# Patient Record
Sex: Male | Born: 1950
Health system: Southern US, Community
[De-identification: ages and names within clinical notes are randomized; demographics above are authoritative.]

## PROBLEM LIST (undated history)

## (undated) DIAGNOSIS — I10 Essential (primary) hypertension: Secondary | ICD-10-CM

## (undated) DIAGNOSIS — F0390 Unspecified dementia without behavioral disturbance: Secondary | ICD-10-CM

## (undated) HISTORY — PX: WRIST SURGERY: SHX841

## (undated) HISTORY — PX: OTHER SURGICAL HISTORY: SHX169

---

## 2006-09-22 ENCOUNTER — Ambulatory Visit (HOSPITAL_COMMUNITY): Admission: RE | Admit: 2006-09-22 | Discharge: 2006-09-22 | Payer: Self-pay | Admitting: Family Medicine

## 2007-11-25 ENCOUNTER — Ambulatory Visit (HOSPITAL_COMMUNITY): Admission: RE | Admit: 2007-11-25 | Discharge: 2007-11-25 | Payer: Self-pay | Admitting: Family Medicine

## 2007-11-29 ENCOUNTER — Ambulatory Visit: Payer: Self-pay | Admitting: Cardiology

## 2007-12-13 ENCOUNTER — Ambulatory Visit: Payer: Self-pay | Admitting: Cardiology

## 2007-12-14 ENCOUNTER — Ambulatory Visit: Payer: Self-pay | Admitting: Cardiology

## 2008-01-27 ENCOUNTER — Ambulatory Visit (HOSPITAL_COMMUNITY): Admission: RE | Admit: 2008-01-27 | Discharge: 2008-01-27 | Payer: Self-pay | Admitting: Internal Medicine

## 2008-01-27 ENCOUNTER — Encounter (INDEPENDENT_AMBULATORY_CARE_PROVIDER_SITE_OTHER): Payer: Self-pay | Admitting: Internal Medicine

## 2008-10-03 ENCOUNTER — Ambulatory Visit (HOSPITAL_COMMUNITY): Admission: RE | Admit: 2008-10-03 | Discharge: 2008-10-03 | Payer: Self-pay | Admitting: Family Medicine

## 2008-11-24 ENCOUNTER — Ambulatory Visit: Admission: RE | Admit: 2008-11-24 | Discharge: 2008-11-24 | Payer: Self-pay | Admitting: Family Medicine

## 2009-10-08 ENCOUNTER — Encounter: Payer: Self-pay | Admitting: Emergency Medicine

## 2009-10-09 ENCOUNTER — Inpatient Hospital Stay (HOSPITAL_COMMUNITY): Admission: EM | Admit: 2009-10-09 | Discharge: 2009-10-16 | Payer: Self-pay | Admitting: Emergency Medicine

## 2009-10-12 ENCOUNTER — Ambulatory Visit: Payer: Self-pay | Admitting: Physical Medicine & Rehabilitation

## 2009-10-18 ENCOUNTER — Encounter
Admission: RE | Admit: 2009-10-18 | Discharge: 2010-01-16 | Payer: Self-pay | Source: Home / Self Care | Admitting: Orthopedic Surgery

## 2009-11-06 ENCOUNTER — Encounter: Admission: RE | Admit: 2009-11-06 | Discharge: 2009-11-06 | Payer: Self-pay | Admitting: Neurosurgery

## 2009-12-10 ENCOUNTER — Ambulatory Visit: Payer: Self-pay | Admitting: Psychology

## 2010-04-12 ENCOUNTER — Ambulatory Visit (INDEPENDENT_AMBULATORY_CARE_PROVIDER_SITE_OTHER): Payer: BC Managed Care – PPO | Admitting: Urology

## 2010-04-12 DIAGNOSIS — R39198 Other difficulties with micturition: Secondary | ICD-10-CM

## 2010-04-12 DIAGNOSIS — R351 Nocturia: Secondary | ICD-10-CM

## 2010-04-12 DIAGNOSIS — N401 Enlarged prostate with lower urinary tract symptoms: Secondary | ICD-10-CM

## 2010-04-25 LAB — CBC
HCT: 41.5 % (ref 39.0–52.0)
MCH: 29.2 pg (ref 26.0–34.0)
MCV: 86.6 fL (ref 78.0–100.0)
Platelets: 171 10*3/uL (ref 150–400)
RDW: 14 % (ref 11.5–15.5)
WBC: 9.1 10*3/uL (ref 4.0–10.5)

## 2010-04-25 LAB — BASIC METABOLIC PANEL
Calcium: 8.8 mg/dL (ref 8.4–10.5)
Chloride: 107 mEq/L (ref 96–112)
Creatinine, Ser: 0.91 mg/dL (ref 0.4–1.5)
Sodium: 141 mEq/L (ref 135–145)

## 2010-04-26 LAB — HEPATIC FUNCTION PANEL
ALT: 16 U/L (ref 0–53)
AST: 23 U/L (ref 0–37)
Indirect Bilirubin: 0.6 mg/dL (ref 0.3–0.9)
Total Bilirubin: 0.8 mg/dL (ref 0.3–1.2)
Total Protein: 6.5 g/dL (ref 6.0–8.3)

## 2010-04-26 LAB — DIFFERENTIAL
Basophils Absolute: 0.5 10*3/uL — ABNORMAL HIGH (ref 0.0–0.1)
Basophils Relative: 2 % — ABNORMAL HIGH (ref 0–1)
Eosinophils Absolute: 0 10*3/uL (ref 0.0–0.7)
Eosinophils Relative: 0 % (ref 0–5)
Lymphs Abs: 1 10*3/uL (ref 0.7–4.0)
Monocytes Absolute: 0.7 10*3/uL (ref 0.1–1.0)
Monocytes Relative: 4 % (ref 3–12)
Neutrophils Relative %: 89 % — ABNORMAL HIGH (ref 43–77)

## 2010-04-26 LAB — BASIC METABOLIC PANEL
BUN: 7 mg/dL (ref 6–23)
BUN: 5 mg/dL — ABNORMAL LOW (ref 6–23)
Calcium: 8.7 mg/dL (ref 8.4–10.5)
Creatinine, Ser: 0.96 mg/dL (ref 0.4–1.5)
Creatinine, Ser: 0.86 mg/dL (ref 0.4–1.5)
GFR calc Af Amer: >60 mL/min (ref >60)
GFR calc non Af Amer: >60 mL/min (ref >60)
Glucose, Bld: 130 mg/dL — ABNORMAL HIGH (ref 70–99)
Potassium: 4.4 mEq/L (ref 3.5–5.1)
Sodium: 137 mEq/L (ref 135–145)
Sodium: 138 mEq/L (ref 135–145)

## 2010-04-26 LAB — VITAMIN B12: Vitamin B-12: 386 pg/mL (ref 211–911)

## 2010-04-26 LAB — ETHANOL: Alcohol, Ethyl (B): <5 mg/dL (ref 0–10)

## 2010-04-26 LAB — CBC: Hemoglobin: 15.1 g/dL (ref 13.0–17.0)

## 2010-06-25 NOTE — Procedures (Signed)
[facility]                              EXERCISE TREADMILL   NAME:Luis Warner, Luis Warner                     MRN:          [id number]  DATE:12/13/2007                            DOB:          08-06-1950    REFERRING PHYSICIAN:  Angus G. Renard Matter, MD   CLINICAL DATA:  A 60 year old gentleman with chest discomfort.  1. Treadmill exercise performed to a workload of 10.4 METS and a heart      rate of 144, 89% of age-predicted maximum.  2. Exercise discontinued due to dyspnea; no chest pain reported.  3. Blood pressure increased from a resting value of 160/85 to 180/80      during exercise, a flat response from a hypertensive baseline.  4. No arrhythmias noted.  5. Baseline EKG:  Normal sinus rhythm; prominent voltage;      nondiagnostic inferior Q waves.   STRESS EKG:  1.5-2 mm of upsloping ST-segment depression in the inferior  leads as well as V5-V6.  EKG changes reverted towards baseline gradually  during the 5 minutes of recovery.   IMPRESSION:  Negative-graded exercise test revealing adequate exercise  capacity, a flat blood pressure response with resting hypertension, no  reproduction of the patient's chest discomfort with exertion and no  diagnostic electrocardiographic evidence for myocardial ischemia.  Other  findings as noted.     Gerrit Friends. Dietrich Pates, MD, [facility]  Electronically Signed    RMR/MedQ  DD: 12/14/2007  DT: 12/15/2007  Job #: [id number]

## 2010-06-25 NOTE — Op Note (Signed)
NAME:  Luis Warner, Luis Warner              ACCOUNT NO.:  [id number]   MEDICAL RECORD NO.:  [id number]          PATIENT TYPE:  AMB   LOCATION:  DAY                           FACILITY:  [facility]   PHYSICIAN:  Lionel December, M.D.    DATE OF BIRTH:  Jul 17, 1950   DATE OF PROCEDURE:  01/27/2008  DATE OF DISCHARGE:                               OPERATIVE REPORT   PROCEDURE:  Colonoscopy with snare polypectomy.   INDICATIONS:  [name] is a 60 year old Caucasian male who is undergoing  average risk screening colonoscopy.  Procedure and risks were reviewed  with the patient and informed consent was obtained.   MEDICATIONS FOR CONSCIOUS SEDATION:  Demerol 50 mg IV and Versed 5 mg  IV.   FINDINGS:  Procedure performed in endoscopy suite.  The patient's vital  signs and O2 sat were monitored during the procedure and remained  stable.  The patient was placed in left lateral recumbent position,  rectal examination performed.  No abnormality noted on external or  digital exam.  Pentax videoscope was placed in the rectum and advanced  under vision into sigmoid colon beyond.  Preparation was excellent.  Two  tiny diverticula were noted in the sigmoid colon.  Scope was passed into  the cecum, which was identified by appendiceal orifice and ileocecal  valve.  Pictures taken for the record.  As the scope was withdrawn,  colonic mucosa was carefully examined.  There was a 5-mm polyp at the  distal transverse colon, which was snared.  Rest of the polyp was  coagulated during this process.  Polypectomy was complete.  There was a  15-mm pedunculated polyp at distal sigmoid colon.  This was snared and  retrieved for histologic examination.  Mucosa of rest of the colon and  rectum was normal.  While in the rectum, scope was retroflexed to  examine anorectal junction, which was unremarkable.  Endoscope was  straightened and withdrawn.  The patient tolerated the procedure well.   FINAL DIAGNOSES:  1. Examination performed  to cecum.  2. A 15-mm pedunculated polyp snared from sigmoid colon.  3. Another 5-mm polyp snared from transverse colon.  4. Two tiny diverticula at sigmoid colon.   RECOMMENDATIONS:  1. No aspirin for 10 days.  2. I will be contacting the patient with results of biopsy and further      recommendations.      Lionel December, M.D.  Electronically Signed     NR/MEDQ  D:  01/27/2008  T:  01/28/2008  Job:  [id number]   cc:   Angus G. Renard Matter, MD  Fax: [phone]

## 2010-06-25 NOTE — Letter (Signed)
November 29, 2007    Angus G. Renard Matter, MD  [address]  [place], [place] [address]   RE:  BOWDEN, BOODY  MRN:  [id number]  /  DOB:  [birth year]   Dear Thalia Party:   It was my pleasure evaluating Mr. States in the office today at your  request for chest discomfort.  As you know, this nice gentleman has  enjoyed generally excellent health.  He has only been hospitalized once  in the past for an ORIF of a left radial fracture.  He has an 80-pack-  year history of cigarette smoking, but does not have symptoms of chronic  bronchitis.  A recent chest x-ray shows emphysematous changes and  vascular calcifications.  He has not had diabetes nor hypertension.  He  takes no medication routinely except for aspirin.  He has used anti-  inflammatory agents and analgesics in the past for DJD of the  lumbosacral spine.  Over the past few months, he notes intermittent  chest discomfort.  This typically occurs at rest, when he is watching  television.  It is a sense of mild pressure diffusely over the left  chest.  There is no associated diaphoresis, nausea nor dyspnea.  There  is no relationship to exertion.  There is no radiation.  Symptoms pass  spontaneously over the course of an hour or so.  He typically does not  notice the symptoms unless he is sitting quietly.   PAST MEDICAL HISTORY:  Notable for psoriasis.   ALLERGIES:  He notes an allergy to PENICILLIN.   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  He is active in frequent [place], which she  tolerates generally well.  He does note some dyspnea with moderate  exertion.  He is married with 2 adult children and lives locally.   FAMILY HISTORY:  Positive for coronary artery disease including one  brother who has required percutaneous intervention.   REVIEW OF SYSTEMS:  Notable for the need for corrective lenses, upper  dentures, a remote history of peptic ulcer disease, urinary  frequency  and psoriasis.  All other systems reviewed and are negative.  Mr. Leinbach  experienced 3 episodes of falling out of bed approximately a month or  two ago.  He believes he was unconscious due to head trauma on one  occasion.  These spells may have occurred at the time when he was taking  oral narcotics for back pain.  He has not had a similar occurrence over  the past month or two.   PHYSICAL EXAMINATION:  GENERAL:  On exam, pleasant proportionate  gentleman in no acute distress.  VITAL SIGNS:  The weight is 190.  Blood pressure 135/75, heart rate 75  and regular, respirations 12 and unlabored.  HEENT:  Anicteric sclerae; normal oral mucosa.  NECK:  No jugular venous distention; no carotid bruits.  ENDOCRINE:  No thyromegaly.  HEMATOPOIETIC:  No adenopathy.  SKIN:  Increased pigmentation over the elbows.  LUNGS:  Clear with some increase in the expiratory phase.  CARDIAC:  Normal first and second heart sounds.  Normal PMI.  ABDOMEN:  Soft and nontender; no bruits; aortic pulsation not palpable;  no masses nor organomegaly.  EXTREMITIES:  Normal distal pulses; no edema.  NEUROLOGIC:  Symmetric strength and tone; normal cranial nerves.   EKG:  Normal sinus rhythm; prominent voltage; borderline left atrial  abnormality; ST-segment coving.  No prior tracing for comparison.   LABORATORY  DATA:  From your office includes a normal CBC, normal  chemistry profile, fairly good lipids with total cholesterol of 188, HDL  of 48 and LDL of 123, a normal TSH and normal PSA.   IMPRESSION:  Mr. Molzahn has atypical symptoms that primarily occur at  rest.  I do not think that this likely reflects coronary artery disease,  although he certainly has significant risk factors for atherosclerosis.  Since he has a normal EKG and likely good exercise tolerance, we will  proceed with a standard treadmill stress test.   Otherwise, I think the primary goal for Mr. Greulich health should be   discontinuation of cigarette smoking.  He does not appear incline to  pursue that venture at the present time.   His sleep disturbance is now apparently resolved.  This may have related  to use of narcotic analgesics.  If there is a recurrence, he can be  referred to one of the sleep specialist in [place].   I greatly appreciate the opportunity to evaluate Mr. Gilkes and we will  let you know the results of his stress test as soon as it has been  performed.  If they are good, I do not think he needs routine cardiology  followup.    Sincerely,      Gerrit Friends. Dietrich Pates, MD, [facility]  Electronically Signed    RMR/MedQ  DD: 11/29/2007  DT: 11/30/2007  Job #: [id number]

## 2010-11-29 ENCOUNTER — Ambulatory Visit (INDEPENDENT_AMBULATORY_CARE_PROVIDER_SITE_OTHER): Payer: BC Managed Care – PPO | Admitting: Urology

## 2010-11-29 DIAGNOSIS — R351 Nocturia: Secondary | ICD-10-CM

## 2010-11-29 DIAGNOSIS — N138 Other obstructive and reflux uropathy: Secondary | ICD-10-CM

## 2010-11-29 DIAGNOSIS — N401 Enlarged prostate with lower urinary tract symptoms: Secondary | ICD-10-CM

## 2011-04-03 DIAGNOSIS — S0280XA Fracture of other specified skull and facial bones, unspecified side, initial encounter for closed fracture: Secondary | ICD-10-CM

## 2011-04-03 DIAGNOSIS — F331 Major depressive disorder, recurrent, moderate: Secondary | ICD-10-CM

## 2011-04-03 DIAGNOSIS — S064X9A Epidural hemorrhage with loss of consciousness of unspecified duration, initial encounter: Secondary | ICD-10-CM

## 2011-04-03 DIAGNOSIS — S066X9A Traumatic subarachnoid hemorrhage with loss of consciousness of unspecified duration, initial encounter: Secondary | ICD-10-CM

## 2011-04-03 DIAGNOSIS — S065X9A Traumatic subdural hemorrhage with loss of consciousness of unspecified duration, initial encounter: Secondary | ICD-10-CM

## 2011-04-03 DIAGNOSIS — F079 Unspecified personality and behavioral disorder due to known physiological condition: Secondary | ICD-10-CM

## 2011-04-03 DIAGNOSIS — X58XXXA Exposure to other specified factors, initial encounter: Secondary | ICD-10-CM

## 2011-08-22 ENCOUNTER — Ambulatory Visit (INDEPENDENT_AMBULATORY_CARE_PROVIDER_SITE_OTHER): Payer: BC Managed Care – PPO | Admitting: Urology

## 2011-08-22 DIAGNOSIS — R351 Nocturia: Secondary | ICD-10-CM

## 2011-08-22 DIAGNOSIS — N401 Enlarged prostate with lower urinary tract symptoms: Secondary | ICD-10-CM

## 2011-08-22 DIAGNOSIS — R350 Frequency of micturition: Secondary | ICD-10-CM

## 2012-04-08 DIAGNOSIS — F4310 Post-traumatic stress disorder, unspecified: Secondary | ICD-10-CM | POA: Diagnosis not present

## 2012-04-23 DIAGNOSIS — F4310 Post-traumatic stress disorder, unspecified: Secondary | ICD-10-CM | POA: Diagnosis not present

## 2012-05-06 DIAGNOSIS — F09 Unspecified mental disorder due to known physiological condition: Secondary | ICD-10-CM | POA: Diagnosis not present

## 2012-05-06 DIAGNOSIS — F331 Major depressive disorder, recurrent, moderate: Secondary | ICD-10-CM | POA: Diagnosis not present

## 2012-05-06 DIAGNOSIS — F4310 Post-traumatic stress disorder, unspecified: Secondary | ICD-10-CM | POA: Diagnosis not present

## 2012-05-20 DIAGNOSIS — R41844 Frontal lobe and executive function deficit: Secondary | ICD-10-CM

## 2012-05-20 DIAGNOSIS — R413 Other amnesia: Secondary | ICD-10-CM

## 2012-05-20 DIAGNOSIS — F329 Major depressive disorder, single episode, unspecified: Secondary | ICD-10-CM

## 2012-05-20 DIAGNOSIS — X58XXXA Exposure to other specified factors, initial encounter: Secondary | ICD-10-CM

## 2012-05-20 DIAGNOSIS — R41840 Attention and concentration deficit: Secondary | ICD-10-CM

## 2012-05-20 DIAGNOSIS — F09 Unspecified mental disorder due to known physiological condition: Secondary | ICD-10-CM | POA: Diagnosis not present

## 2012-05-20 DIAGNOSIS — F4310 Post-traumatic stress disorder, unspecified: Secondary | ICD-10-CM | POA: Diagnosis not present

## 2012-05-20 DIAGNOSIS — S069X9A Unspecified intracranial injury with loss of consciousness of unspecified duration, initial encounter: Secondary | ICD-10-CM

## 2012-05-20 DIAGNOSIS — F331 Major depressive disorder, recurrent, moderate: Secondary | ICD-10-CM | POA: Diagnosis not present

## 2012-06-17 DIAGNOSIS — F4310 Post-traumatic stress disorder, unspecified: Secondary | ICD-10-CM | POA: Diagnosis not present

## 2012-06-17 DIAGNOSIS — F331 Major depressive disorder, recurrent, moderate: Secondary | ICD-10-CM | POA: Diagnosis not present

## 2012-06-17 DIAGNOSIS — F09 Unspecified mental disorder due to known physiological condition: Secondary | ICD-10-CM | POA: Diagnosis not present

## 2012-10-01 ENCOUNTER — Ambulatory Visit (INDEPENDENT_AMBULATORY_CARE_PROVIDER_SITE_OTHER): Payer: Medicare Other | Admitting: Urology

## 2012-10-01 DIAGNOSIS — N138 Other obstructive and reflux uropathy: Secondary | ICD-10-CM

## 2012-10-01 DIAGNOSIS — N401 Enlarged prostate with lower urinary tract symptoms: Secondary | ICD-10-CM

## 2012-10-01 DIAGNOSIS — R351 Nocturia: Secondary | ICD-10-CM

## 2012-10-01 DIAGNOSIS — R6882 Decreased libido: Secondary | ICD-10-CM

## 2013-01-18 ENCOUNTER — Encounter (INDEPENDENT_AMBULATORY_CARE_PROVIDER_SITE_OTHER): Payer: Self-pay | Admitting: *Deleted

## 2013-02-17 DIAGNOSIS — F09 Unspecified mental disorder due to known physiological condition: Secondary | ICD-10-CM | POA: Diagnosis not present

## 2013-02-17 DIAGNOSIS — F4310 Post-traumatic stress disorder, unspecified: Secondary | ICD-10-CM | POA: Diagnosis not present

## 2013-02-17 DIAGNOSIS — F331 Major depressive disorder, recurrent, moderate: Secondary | ICD-10-CM | POA: Diagnosis not present

## 2013-02-28 DIAGNOSIS — F331 Major depressive disorder, recurrent, moderate: Secondary | ICD-10-CM | POA: Diagnosis not present

## 2013-02-28 DIAGNOSIS — F09 Unspecified mental disorder due to known physiological condition: Secondary | ICD-10-CM | POA: Diagnosis not present

## 2013-02-28 DIAGNOSIS — F4310 Post-traumatic stress disorder, unspecified: Secondary | ICD-10-CM | POA: Diagnosis not present

## 2013-03-17 DIAGNOSIS — F09 Unspecified mental disorder due to known physiological condition: Secondary | ICD-10-CM | POA: Diagnosis not present

## 2013-03-17 DIAGNOSIS — F331 Major depressive disorder, recurrent, moderate: Secondary | ICD-10-CM | POA: Diagnosis not present

## 2013-03-17 DIAGNOSIS — F4310 Post-traumatic stress disorder, unspecified: Secondary | ICD-10-CM | POA: Diagnosis not present

## 2013-03-30 DIAGNOSIS — F331 Major depressive disorder, recurrent, moderate: Secondary | ICD-10-CM | POA: Diagnosis not present

## 2013-03-30 DIAGNOSIS — F4310 Post-traumatic stress disorder, unspecified: Secondary | ICD-10-CM | POA: Diagnosis not present

## 2013-03-30 DIAGNOSIS — F09 Unspecified mental disorder due to known physiological condition: Secondary | ICD-10-CM | POA: Diagnosis not present

## 2013-04-14 DIAGNOSIS — F331 Major depressive disorder, recurrent, moderate: Secondary | ICD-10-CM | POA: Diagnosis not present

## 2013-04-14 DIAGNOSIS — F09 Unspecified mental disorder due to known physiological condition: Secondary | ICD-10-CM | POA: Diagnosis not present

## 2013-04-14 DIAGNOSIS — F4310 Post-traumatic stress disorder, unspecified: Secondary | ICD-10-CM | POA: Diagnosis not present

## 2013-04-28 DIAGNOSIS — F09 Unspecified mental disorder due to known physiological condition: Secondary | ICD-10-CM | POA: Diagnosis not present

## 2013-04-28 DIAGNOSIS — F4310 Post-traumatic stress disorder, unspecified: Secondary | ICD-10-CM | POA: Diagnosis not present

## 2013-04-28 DIAGNOSIS — F331 Major depressive disorder, recurrent, moderate: Secondary | ICD-10-CM | POA: Diagnosis not present

## 2013-05-12 DIAGNOSIS — F09 Unspecified mental disorder due to known physiological condition: Secondary | ICD-10-CM | POA: Diagnosis not present

## 2013-05-12 DIAGNOSIS — F331 Major depressive disorder, recurrent, moderate: Secondary | ICD-10-CM | POA: Diagnosis not present

## 2013-05-12 DIAGNOSIS — F4310 Post-traumatic stress disorder, unspecified: Secondary | ICD-10-CM | POA: Diagnosis not present

## 2013-06-02 DIAGNOSIS — F331 Major depressive disorder, recurrent, moderate: Secondary | ICD-10-CM | POA: Diagnosis not present

## 2013-06-02 DIAGNOSIS — F09 Unspecified mental disorder due to known physiological condition: Secondary | ICD-10-CM | POA: Diagnosis not present

## 2013-06-02 DIAGNOSIS — F4310 Post-traumatic stress disorder, unspecified: Secondary | ICD-10-CM | POA: Diagnosis not present

## 2013-06-20 DIAGNOSIS — F09 Unspecified mental disorder due to known physiological condition: Secondary | ICD-10-CM | POA: Diagnosis not present

## 2013-06-20 DIAGNOSIS — F331 Major depressive disorder, recurrent, moderate: Secondary | ICD-10-CM | POA: Diagnosis not present

## 2013-06-20 DIAGNOSIS — F4310 Post-traumatic stress disorder, unspecified: Secondary | ICD-10-CM | POA: Diagnosis not present

## 2013-07-14 DIAGNOSIS — F4310 Post-traumatic stress disorder, unspecified: Secondary | ICD-10-CM | POA: Diagnosis not present

## 2013-07-14 DIAGNOSIS — F331 Major depressive disorder, recurrent, moderate: Secondary | ICD-10-CM | POA: Diagnosis not present

## 2013-07-14 DIAGNOSIS — F09 Unspecified mental disorder due to known physiological condition: Secondary | ICD-10-CM | POA: Diagnosis not present

## 2013-08-04 DIAGNOSIS — F4310 Post-traumatic stress disorder, unspecified: Secondary | ICD-10-CM | POA: Diagnosis not present

## 2013-08-04 DIAGNOSIS — F09 Unspecified mental disorder due to known physiological condition: Secondary | ICD-10-CM | POA: Diagnosis not present

## 2013-08-04 DIAGNOSIS — F331 Major depressive disorder, recurrent, moderate: Secondary | ICD-10-CM | POA: Diagnosis not present

## 2013-08-17 DIAGNOSIS — F4310 Post-traumatic stress disorder, unspecified: Secondary | ICD-10-CM | POA: Diagnosis not present

## 2013-08-17 DIAGNOSIS — F331 Major depressive disorder, recurrent, moderate: Secondary | ICD-10-CM | POA: Diagnosis not present

## 2013-08-17 DIAGNOSIS — F09 Unspecified mental disorder due to known physiological condition: Secondary | ICD-10-CM | POA: Diagnosis not present

## 2013-08-31 DIAGNOSIS — F4310 Post-traumatic stress disorder, unspecified: Secondary | ICD-10-CM | POA: Diagnosis not present

## 2013-08-31 DIAGNOSIS — F331 Major depressive disorder, recurrent, moderate: Secondary | ICD-10-CM | POA: Diagnosis not present

## 2013-08-31 DIAGNOSIS — F09 Unspecified mental disorder due to known physiological condition: Secondary | ICD-10-CM | POA: Diagnosis not present

## 2013-09-14 DIAGNOSIS — F4310 Post-traumatic stress disorder, unspecified: Secondary | ICD-10-CM | POA: Diagnosis not present

## 2013-09-14 DIAGNOSIS — F331 Major depressive disorder, recurrent, moderate: Secondary | ICD-10-CM | POA: Diagnosis not present

## 2013-09-14 DIAGNOSIS — F09 Unspecified mental disorder due to known physiological condition: Secondary | ICD-10-CM | POA: Diagnosis not present

## 2013-09-27 DIAGNOSIS — F331 Major depressive disorder, recurrent, moderate: Secondary | ICD-10-CM | POA: Diagnosis not present

## 2013-09-27 DIAGNOSIS — F09 Unspecified mental disorder due to known physiological condition: Secondary | ICD-10-CM | POA: Diagnosis not present

## 2013-09-27 DIAGNOSIS — F4310 Post-traumatic stress disorder, unspecified: Secondary | ICD-10-CM | POA: Diagnosis not present

## 2013-10-10 DIAGNOSIS — N138 Other obstructive and reflux uropathy: Secondary | ICD-10-CM | POA: Diagnosis not present

## 2013-10-10 DIAGNOSIS — N401 Enlarged prostate with lower urinary tract symptoms: Secondary | ICD-10-CM | POA: Diagnosis not present

## 2013-10-12 DIAGNOSIS — F09 Unspecified mental disorder due to known physiological condition: Secondary | ICD-10-CM | POA: Diagnosis not present

## 2013-10-12 DIAGNOSIS — F4310 Post-traumatic stress disorder, unspecified: Secondary | ICD-10-CM | POA: Diagnosis not present

## 2013-10-12 DIAGNOSIS — F331 Major depressive disorder, recurrent, moderate: Secondary | ICD-10-CM | POA: Diagnosis not present

## 2013-10-14 ENCOUNTER — Ambulatory Visit (INDEPENDENT_AMBULATORY_CARE_PROVIDER_SITE_OTHER): Payer: BC Managed Care – PPO | Admitting: Urology

## 2013-10-14 DIAGNOSIS — R350 Frequency of micturition: Secondary | ICD-10-CM

## 2013-10-14 DIAGNOSIS — N401 Enlarged prostate with lower urinary tract symptoms: Secondary | ICD-10-CM

## 2013-10-14 DIAGNOSIS — N138 Other obstructive and reflux uropathy: Secondary | ICD-10-CM | POA: Diagnosis not present

## 2013-10-26 DIAGNOSIS — F09 Unspecified mental disorder due to known physiological condition: Secondary | ICD-10-CM | POA: Diagnosis not present

## 2013-10-26 DIAGNOSIS — F4310 Post-traumatic stress disorder, unspecified: Secondary | ICD-10-CM | POA: Diagnosis not present

## 2013-10-26 DIAGNOSIS — F331 Major depressive disorder, recurrent, moderate: Secondary | ICD-10-CM | POA: Diagnosis not present

## 2013-11-09 DIAGNOSIS — F331 Major depressive disorder, recurrent, moderate: Secondary | ICD-10-CM | POA: Diagnosis not present

## 2013-11-09 DIAGNOSIS — F4310 Post-traumatic stress disorder, unspecified: Secondary | ICD-10-CM | POA: Diagnosis not present

## 2013-11-09 DIAGNOSIS — F09 Unspecified mental disorder due to known physiological condition: Secondary | ICD-10-CM | POA: Diagnosis not present

## 2013-12-14 DIAGNOSIS — L509 Urticaria, unspecified: Secondary | ICD-10-CM | POA: Diagnosis not present

## 2013-12-14 DIAGNOSIS — L400 Psoriasis vulgaris: Secondary | ICD-10-CM | POA: Diagnosis not present

## 2013-12-29 DIAGNOSIS — B355 Tinea imbricata: Secondary | ICD-10-CM | POA: Diagnosis not present

## 2014-03-02 DIAGNOSIS — F331 Major depressive disorder, recurrent, moderate: Secondary | ICD-10-CM | POA: Diagnosis not present

## 2014-03-14 ENCOUNTER — Ambulatory Visit (INDEPENDENT_AMBULATORY_CARE_PROVIDER_SITE_OTHER): Payer: BLUE CROSS/BLUE SHIELD | Admitting: Family Medicine

## 2014-03-14 ENCOUNTER — Encounter: Payer: Self-pay | Admitting: Family Medicine

## 2014-03-14 ENCOUNTER — Other Ambulatory Visit: Payer: Self-pay | Admitting: *Deleted

## 2014-03-14 VITALS — BP 114/76 | Ht 69.0 in | Wt 185.0 lb

## 2014-03-14 DIAGNOSIS — W11XXXA Fall on and from ladder, initial encounter: Secondary | ICD-10-CM | POA: Diagnosis not present

## 2014-03-14 DIAGNOSIS — T148 Other injury of unspecified body region: Secondary | ICD-10-CM | POA: Diagnosis not present

## 2014-03-14 DIAGNOSIS — Z23 Encounter for immunization: Secondary | ICD-10-CM | POA: Diagnosis not present

## 2014-03-14 DIAGNOSIS — L03113 Cellulitis of right upper limb: Secondary | ICD-10-CM | POA: Diagnosis not present

## 2014-03-14 DIAGNOSIS — T148XXA Other injury of unspecified body region, initial encounter: Secondary | ICD-10-CM

## 2014-03-14 MED ORDER — CEPHALEXIN 500 MG PO CAPS: 500.0000 mg | ORAL_CAPSULE | Freq: Three times a day (TID) | ORAL | Status: DC

## 2014-03-14 NOTE — Progress Notes (Signed)
   Subjective:    Patient ID: Luis Warner, male    DOB: Jun 03, 1950, 64 y.o.   MRN: [id number]  HPIFell jan 29th at home. Fell off ladder and ladder fell on him. Puncture wound on right arm. Having rib pain. Getting better but still sore to touch. brusises on legs and hit head.    Right elbow wound has drained a bit.  Complains of pain in the left posterior flank and right lateral chest wall area. Not particularly sharp except with certain motions. No difficulty with deep breaths.  Some lateral neck pain with complete rotation of neck. No numbness no loss of consciousness and loss of strength   Review of Systems ROS otherwise negative    Objective:   Physical Exam  Alert talkative no acute distress neck supple lungs clear heart rare rhythm lateral chest wall tenderness to palpation no hematoma evident right elbow a avulsion injury and puncture wound with slight discharge and erythema      Assessment & Plan:  Impression tissue puncture wound and skin a avulsion plan Keflex 3 times a day 8 days old measures discussed tetanus shot

## 2014-03-22 DIAGNOSIS — F331 Major depressive disorder, recurrent, moderate: Secondary | ICD-10-CM | POA: Diagnosis not present

## 2014-04-05 DIAGNOSIS — F331 Major depressive disorder, recurrent, moderate: Secondary | ICD-10-CM | POA: Diagnosis not present

## 2014-04-19 DIAGNOSIS — F331 Major depressive disorder, recurrent, moderate: Secondary | ICD-10-CM | POA: Diagnosis not present

## 2014-05-03 DIAGNOSIS — F331 Major depressive disorder, recurrent, moderate: Secondary | ICD-10-CM | POA: Diagnosis not present

## 2014-05-25 DIAGNOSIS — F331 Major depressive disorder, recurrent, moderate: Secondary | ICD-10-CM | POA: Diagnosis not present

## 2014-06-07 DIAGNOSIS — F331 Major depressive disorder, recurrent, moderate: Secondary | ICD-10-CM | POA: Diagnosis not present

## 2014-06-21 DIAGNOSIS — F331 Major depressive disorder, recurrent, moderate: Secondary | ICD-10-CM | POA: Diagnosis not present

## 2014-07-05 DIAGNOSIS — F331 Major depressive disorder, recurrent, moderate: Secondary | ICD-10-CM | POA: Diagnosis not present

## 2014-08-01 DIAGNOSIS — F331 Major depressive disorder, recurrent, moderate: Secondary | ICD-10-CM | POA: Diagnosis not present

## 2014-08-02 ENCOUNTER — Ambulatory Visit (INDEPENDENT_AMBULATORY_CARE_PROVIDER_SITE_OTHER): Payer: BLUE CROSS/BLUE SHIELD | Admitting: Family Medicine

## 2014-08-02 ENCOUNTER — Telehealth: Payer: Self-pay | Admitting: Family Medicine

## 2014-08-02 ENCOUNTER — Encounter: Payer: Self-pay | Admitting: Family Medicine

## 2014-08-02 VITALS — BP 122/74 | Ht 70.0 in | Wt 175.0 lb

## 2014-08-02 DIAGNOSIS — R413 Other amnesia: Secondary | ICD-10-CM | POA: Diagnosis not present

## 2014-08-02 DIAGNOSIS — Z139 Encounter for screening, unspecified: Secondary | ICD-10-CM | POA: Diagnosis not present

## 2014-08-02 DIAGNOSIS — Z79899 Other long term (current) drug therapy: Secondary | ICD-10-CM | POA: Diagnosis not present

## 2014-08-02 DIAGNOSIS — F09 Unspecified mental disorder due to known physiological condition: Secondary | ICD-10-CM

## 2014-08-02 DIAGNOSIS — Z125 Encounter for screening for malignant neoplasm of prostate: Secondary | ICD-10-CM

## 2014-08-02 DIAGNOSIS — R270 Ataxia, unspecified: Secondary | ICD-10-CM | POA: Diagnosis not present

## 2014-08-02 DIAGNOSIS — S069X1D Unspecified intracranial injury with loss of consciousness of 30 minutes or less, subsequent encounter: Secondary | ICD-10-CM

## 2014-08-02 NOTE — Telephone Encounter (Signed)
Patients spouse called the hospital today and said that there is a possibility that patient can get in before the end of the month to have his MRI done that he was scheduled for today.  They are just going to need the pre-authorization.  She would like a nurse to call her back to discuss this.

## 2014-08-02 NOTE — Telephone Encounter (Signed)
Patient on waiting list for cancellation for MRI and wife states we must already have precert ready in case of cancellation so they can go earlier

## 2014-08-02 NOTE — Progress Notes (Signed)
Subjective:    Patient ID: Luis Warner, male    DOB: 1950/03/31, 64 y.o.   MRN: [id number]  HPI [place] a few years ago and fractured skull. Since then has had falls and has problems since fall. Being combative, hard to stay awake, has bad night terrors, aggressive. Falls a lot. Fell last week and hit head.  MMSE 27/30  This patient had a severe injury back in 2011 ever since then he has had significant cognitive changes if occult he thinking difficulty processing this has resulted in the patient having difficulty with memory difficulty with functioning some days has very little ability to function other days does very well currently they're under the care of a psychiatrist at one time he was under the care of a male psychiatrist he did very well with him then she retired they're starting to get used to the new psychiatrist.  Review of Systems  Constitutional: Positive for activity change and fatigue. Negative for appetite change.  HENT: Negative for congestion.   Respiratory: Negative for cough.   Cardiovascular: Negative for chest pain.  Gastrointestinal: Negative for abdominal pain.  Musculoskeletal: Positive for back pain. Negative for arthralgias.  Neurological: Positive for dizziness, speech difficulty, weakness, light-headedness and headaches.  Psychiatric/Behavioral: Positive for behavioral problems, confusion, sleep disturbance, dysphoric mood and decreased concentration. Negative for agitation.       Objective:   Physical Exam  Constitutional: He appears well-developed and well-nourished.  HENT:  Head: Normocephalic.  Right Ear: External ear normal.  Left Ear: External ear normal.  Mouth/Throat: No oropharyngeal exudate.  Eyes: Pupils are equal, round, and reactive to light.  Neck: Normal range of motion.  Cardiovascular: Normal rate and regular rhythm.   Pulmonary/Chest: Effort normal.  Musculoskeletal: Normal range of motion.  Neurological: He exhibits normal muscle  tone. Coordination abnormal.  Skin: He is not diaphoretic.  Psychiatric: His behavior is normal.    The patient has a very difficult time recalling what is being said to him at home. Today he scored 27 out of 30. His wife states that on other days there is no way he could score that well. Patient did have ataxia with Romberg and with walking in the hallway      Assessment & Plan:  1. Short-term memory loss This does not appear to be severe enough to be on medication secondly it's due to traumatic brain injury to therefore medicine probably would not be of any benefit - MR Brain Wo Contrast  2. Cognitive dysfunction This patient does have some thinking disorders but not bad enough to be incompetent. - MR Brain Wo Contrast  3. Traumatic brain injury, with loss of consciousness of 30 minutes or less, subsequent encounter This patient is having significant worsening of ataxia imbalance problems frequent falls he is also having personality changes as well as memory dysfunction I believe this patient would benefit from another MRI to make sure that there is not strokes or atrophy of the brain occurring - MR Brain Wo Contrast  4. Screening for prostate cancer Screening - PSA  5. Screening Screening - Lipid panel - Hepatic function panel - Basic metabolic panel  6. High risk medication use Screening - Hepatic function panel - Basic metabolic panel  I recommended that this patient do not do any activity on ladders tabletops or chairs Also recommended this patient only drive locally 45 miles an hour or less to avoid any congested areas. Await the findings of the tests before pursuing other  avenues  30 minutes spent with the patient regarding all of these issues and the wife. Very nice couple.

## 2014-08-08 NOTE — Telephone Encounter (Signed)
Patient wife is asking that the precert be done ASAP before their deductible starts over the first of July.  She says that she is supposed to call the hospital and let them know this has been done and they are going to try to get him in.

## 2014-08-08 NOTE — Telephone Encounter (Signed)
Prior auth done & PA# info noted in referral & appt desk & pt's wife notified

## 2014-08-09 ENCOUNTER — Ambulatory Visit (HOSPITAL_COMMUNITY)
Admission: RE | Admit: 2014-08-09 | Discharge: 2014-08-09 | Disposition: A | Discharge status: Home / Self Care | Payer: BLUE CROSS/BLUE SHIELD | Source: Ambulatory Visit | Attending: Family Medicine | Admitting: Family Medicine

## 2014-08-09 DIAGNOSIS — X58XXXD Exposure to other specified factors, subsequent encounter: Secondary | ICD-10-CM | POA: Diagnosis not present

## 2014-08-09 DIAGNOSIS — R42 Dizziness and giddiness: Secondary | ICD-10-CM | POA: Insufficient documentation

## 2014-08-09 DIAGNOSIS — R412 Retrograde amnesia: Secondary | ICD-10-CM | POA: Diagnosis not present

## 2014-08-09 DIAGNOSIS — R531 Weakness: Secondary | ICD-10-CM | POA: Diagnosis not present

## 2014-08-09 DIAGNOSIS — G9389 Other specified disorders of brain: Secondary | ICD-10-CM | POA: Diagnosis not present

## 2014-08-09 DIAGNOSIS — S06890D Other specified intracranial injury without loss of consciousness, subsequent encounter: Secondary | ICD-10-CM | POA: Diagnosis not present

## 2014-08-09 DIAGNOSIS — R413 Other amnesia: Secondary | ICD-10-CM | POA: Diagnosis not present

## 2014-08-09 DIAGNOSIS — F09 Unspecified mental disorder due to known physiological condition: Secondary | ICD-10-CM | POA: Diagnosis not present

## 2014-08-09 DIAGNOSIS — S0990XA Unspecified injury of head, initial encounter: Secondary | ICD-10-CM | POA: Diagnosis not present

## 2014-08-16 ENCOUNTER — Inpatient Hospital Stay (HOSPITAL_COMMUNITY): Admission: RE | Admit: 2014-08-16 | Payer: Medicare Other | Source: Ambulatory Visit

## 2014-08-16 DIAGNOSIS — F331 Major depressive disorder, recurrent, moderate: Secondary | ICD-10-CM | POA: Diagnosis not present

## 2014-08-30 DIAGNOSIS — F331 Major depressive disorder, recurrent, moderate: Secondary | ICD-10-CM | POA: Diagnosis not present

## 2014-09-06 DIAGNOSIS — N401 Enlarged prostate with lower urinary tract symptoms: Secondary | ICD-10-CM | POA: Diagnosis not present

## 2014-09-13 DIAGNOSIS — F331 Major depressive disorder, recurrent, moderate: Secondary | ICD-10-CM | POA: Diagnosis not present

## 2014-09-23 LAB — BASIC METABOLIC PANEL
BUN / CREAT RATIO: 12 (ref 10–22)
BUN: 12 mg/dL (ref 8–27)
CO2: 24 mmol/L (ref 18–29)
CREATININE: 0.98 mg/dL (ref 0.76–1.27)
Calcium: 8.9 mg/dL (ref 8.6–10.2)
Chloride: 99 mmol/L (ref 97–108)
GFR calc non Af Amer: 80 mL/min/{1.73_m2} (ref >59)
GFR, EST AFRICAN AMERICAN: 92 mL/min/{1.73_m2} (ref >59)
Glucose: 100 mg/dL — ABNORMAL HIGH (ref 65–99)
Potassium: 4.4 mmol/L (ref 3.5–5.2)
SODIUM: 140 mmol/L (ref 134–144)

## 2014-09-23 LAB — HEPATIC FUNCTION PANEL
ALK PHOS: 96 IU/L (ref 39–117)
ALT: 12 IU/L (ref 0–44)
AST: 17 IU/L (ref 0–40)
Albumin: 4 g/dL (ref 3.6–4.8)
BILIRUBIN, DIRECT: 0.08 mg/dL (ref 0.00–0.40)
Bilirubin Total: 0.2 mg/dL (ref 0.0–1.2)
Total Protein: 6.5 g/dL (ref 6.0–8.5)

## 2014-09-23 LAB — LIPID PANEL
CHOLESTEROL TOTAL: 175 mg/dL (ref 100–199)
Chol/HDL Ratio: 3.9 ratio units (ref 0.0–5.0)
HDL: 45 mg/dL (ref >39)
LDL Calculated: 109 mg/dL — ABNORMAL HIGH (ref 0–99)
Triglycerides: 104 mg/dL (ref 0–149)
VLDL CHOLESTEROL CAL: 21 mg/dL (ref 5–40)

## 2014-09-23 LAB — PSA: Prostate Specific Ag, Serum: 1.4 ng/mL (ref 0.0–4.0)

## 2014-09-24 ENCOUNTER — Encounter: Payer: Self-pay | Admitting: Family Medicine

## 2014-09-27 DIAGNOSIS — F331 Major depressive disorder, recurrent, moderate: Secondary | ICD-10-CM | POA: Diagnosis not present

## 2014-10-11 DIAGNOSIS — F331 Major depressive disorder, recurrent, moderate: Secondary | ICD-10-CM | POA: Diagnosis not present

## 2014-10-13 ENCOUNTER — Ambulatory Visit (INDEPENDENT_AMBULATORY_CARE_PROVIDER_SITE_OTHER): Payer: BLUE CROSS/BLUE SHIELD | Admitting: Urology

## 2014-10-13 DIAGNOSIS — R351 Nocturia: Secondary | ICD-10-CM | POA: Diagnosis not present

## 2014-10-13 DIAGNOSIS — N401 Enlarged prostate with lower urinary tract symptoms: Secondary | ICD-10-CM | POA: Diagnosis not present

## 2014-10-13 DIAGNOSIS — N5201 Erectile dysfunction due to arterial insufficiency: Secondary | ICD-10-CM

## 2014-11-08 DIAGNOSIS — F331 Major depressive disorder, recurrent, moderate: Secondary | ICD-10-CM | POA: Diagnosis not present

## 2014-11-22 DIAGNOSIS — F331 Major depressive disorder, recurrent, moderate: Secondary | ICD-10-CM | POA: Diagnosis not present

## 2014-12-06 DIAGNOSIS — F331 Major depressive disorder, recurrent, moderate: Secondary | ICD-10-CM | POA: Diagnosis not present

## 2014-12-19 DIAGNOSIS — Z23 Encounter for immunization: Secondary | ICD-10-CM | POA: Diagnosis not present

## 2014-12-20 DIAGNOSIS — F331 Major depressive disorder, recurrent, moderate: Secondary | ICD-10-CM | POA: Diagnosis not present

## 2015-01-17 DIAGNOSIS — F331 Major depressive disorder, recurrent, moderate: Secondary | ICD-10-CM | POA: Diagnosis not present

## 2015-01-31 DIAGNOSIS — F331 Major depressive disorder, recurrent, moderate: Secondary | ICD-10-CM | POA: Diagnosis not present

## 2015-02-28 DIAGNOSIS — F331 Major depressive disorder, recurrent, moderate: Secondary | ICD-10-CM | POA: Diagnosis not present

## 2015-03-14 DIAGNOSIS — F331 Major depressive disorder, recurrent, moderate: Secondary | ICD-10-CM | POA: Diagnosis not present

## 2015-03-28 DIAGNOSIS — F331 Major depressive disorder, recurrent, moderate: Secondary | ICD-10-CM | POA: Diagnosis not present

## 2015-04-11 DIAGNOSIS — F331 Major depressive disorder, recurrent, moderate: Secondary | ICD-10-CM | POA: Diagnosis not present

## 2015-04-25 DIAGNOSIS — F331 Major depressive disorder, recurrent, moderate: Secondary | ICD-10-CM | POA: Diagnosis not present

## 2015-05-09 DIAGNOSIS — F331 Major depressive disorder, recurrent, moderate: Secondary | ICD-10-CM | POA: Diagnosis not present

## 2015-05-30 DIAGNOSIS — F331 Major depressive disorder, recurrent, moderate: Secondary | ICD-10-CM | POA: Diagnosis not present

## 2015-06-22 DIAGNOSIS — F331 Major depressive disorder, recurrent, moderate: Secondary | ICD-10-CM | POA: Diagnosis not present

## 2015-07-11 DIAGNOSIS — F331 Major depressive disorder, recurrent, moderate: Secondary | ICD-10-CM | POA: Diagnosis not present

## 2015-08-01 DIAGNOSIS — F331 Major depressive disorder, recurrent, moderate: Secondary | ICD-10-CM | POA: Diagnosis not present

## 2015-09-26 DIAGNOSIS — F331 Major depressive disorder, recurrent, moderate: Secondary | ICD-10-CM | POA: Diagnosis not present

## 2015-10-01 DIAGNOSIS — Z23 Encounter for immunization: Secondary | ICD-10-CM | POA: Diagnosis not present

## 2015-10-05 ENCOUNTER — Other Ambulatory Visit: Payer: Self-pay

## 2015-10-10 DIAGNOSIS — F331 Major depressive disorder, recurrent, moderate: Secondary | ICD-10-CM | POA: Diagnosis not present

## 2015-10-14 DIAGNOSIS — H6123 Impacted cerumen, bilateral: Secondary | ICD-10-CM | POA: Diagnosis not present

## 2015-11-02 DIAGNOSIS — F331 Major depressive disorder, recurrent, moderate: Secondary | ICD-10-CM | POA: Diagnosis not present

## 2015-11-21 DIAGNOSIS — F331 Major depressive disorder, recurrent, moderate: Secondary | ICD-10-CM | POA: Diagnosis not present

## 2015-12-03 ENCOUNTER — Ambulatory Visit (INDEPENDENT_AMBULATORY_CARE_PROVIDER_SITE_OTHER): Payer: BLUE CROSS/BLUE SHIELD | Admitting: Otolaryngology

## 2015-12-03 DIAGNOSIS — H903 Sensorineural hearing loss, bilateral: Secondary | ICD-10-CM | POA: Diagnosis not present

## 2015-12-05 DIAGNOSIS — F331 Major depressive disorder, recurrent, moderate: Secondary | ICD-10-CM | POA: Diagnosis not present

## 2015-12-14 ENCOUNTER — Ambulatory Visit: Payer: BLUE CROSS/BLUE SHIELD | Admitting: Urology

## 2015-12-14 ENCOUNTER — Ambulatory Visit (INDEPENDENT_AMBULATORY_CARE_PROVIDER_SITE_OTHER): Payer: BLUE CROSS/BLUE SHIELD | Admitting: Urology

## 2015-12-14 DIAGNOSIS — N5201 Erectile dysfunction due to arterial insufficiency: Secondary | ICD-10-CM | POA: Diagnosis not present

## 2015-12-14 DIAGNOSIS — N401 Enlarged prostate with lower urinary tract symptoms: Secondary | ICD-10-CM | POA: Diagnosis not present

## 2015-12-14 DIAGNOSIS — N3281 Overactive bladder: Secondary | ICD-10-CM | POA: Diagnosis not present

## 2015-12-19 DIAGNOSIS — F331 Major depressive disorder, recurrent, moderate: Secondary | ICD-10-CM | POA: Diagnosis not present

## 2016-01-09 DIAGNOSIS — F331 Major depressive disorder, recurrent, moderate: Secondary | ICD-10-CM | POA: Diagnosis not present

## 2016-02-13 DIAGNOSIS — F331 Major depressive disorder, recurrent, moderate: Secondary | ICD-10-CM | POA: Diagnosis not present

## 2016-03-12 DIAGNOSIS — F331 Major depressive disorder, recurrent, moderate: Secondary | ICD-10-CM | POA: Diagnosis not present

## 2016-03-14 ENCOUNTER — Encounter: Payer: Self-pay | Admitting: Internal Medicine

## 2016-04-09 DIAGNOSIS — F331 Major depressive disorder, recurrent, moderate: Secondary | ICD-10-CM | POA: Diagnosis not present

## 2016-05-07 DIAGNOSIS — F331 Major depressive disorder, recurrent, moderate: Secondary | ICD-10-CM | POA: Diagnosis not present

## 2016-06-10 DIAGNOSIS — F331 Major depressive disorder, recurrent, moderate: Secondary | ICD-10-CM | POA: Diagnosis not present

## 2016-07-09 DIAGNOSIS — F331 Major depressive disorder, recurrent, moderate: Secondary | ICD-10-CM | POA: Diagnosis not present

## 2016-07-31 DIAGNOSIS — X32XXXD Exposure to sunlight, subsequent encounter: Secondary | ICD-10-CM | POA: Diagnosis not present

## 2016-07-31 DIAGNOSIS — D044 Carcinoma in situ of skin of scalp and neck: Secondary | ICD-10-CM | POA: Diagnosis not present

## 2016-07-31 DIAGNOSIS — L400 Psoriasis vulgaris: Secondary | ICD-10-CM | POA: Diagnosis not present

## 2016-07-31 DIAGNOSIS — C4441 Basal cell carcinoma of skin of scalp and neck: Secondary | ICD-10-CM | POA: Diagnosis not present

## 2016-07-31 DIAGNOSIS — D225 Melanocytic nevi of trunk: Secondary | ICD-10-CM | POA: Diagnosis not present

## 2016-07-31 DIAGNOSIS — L570 Actinic keratosis: Secondary | ICD-10-CM | POA: Diagnosis not present

## 2016-08-27 DIAGNOSIS — F331 Major depressive disorder, recurrent, moderate: Secondary | ICD-10-CM | POA: Diagnosis not present

## 2016-10-01 DIAGNOSIS — F331 Major depressive disorder, recurrent, moderate: Secondary | ICD-10-CM | POA: Diagnosis not present

## 2016-10-28 DIAGNOSIS — F331 Major depressive disorder, recurrent, moderate: Secondary | ICD-10-CM | POA: Diagnosis not present

## 2016-11-26 DIAGNOSIS — F331 Major depressive disorder, recurrent, moderate: Secondary | ICD-10-CM | POA: Diagnosis not present

## 2016-12-24 DIAGNOSIS — F331 Major depressive disorder, recurrent, moderate: Secondary | ICD-10-CM | POA: Diagnosis not present

## 2017-01-07 ENCOUNTER — Encounter: Payer: Self-pay | Admitting: Family Medicine

## 2017-01-07 ENCOUNTER — Ambulatory Visit (INDEPENDENT_AMBULATORY_CARE_PROVIDER_SITE_OTHER): Payer: Commercial Managed Care - PPO | Admitting: Family Medicine

## 2017-01-07 VITALS — BP 132/82 | Temp 97.9°F | Wt 179.6 lb

## 2017-01-07 DIAGNOSIS — J019 Acute sinusitis, unspecified: Secondary | ICD-10-CM | POA: Diagnosis not present

## 2017-01-07 DIAGNOSIS — Z131 Encounter for screening for diabetes mellitus: Secondary | ICD-10-CM | POA: Diagnosis not present

## 2017-01-07 DIAGNOSIS — Z125 Encounter for screening for malignant neoplasm of prostate: Secondary | ICD-10-CM

## 2017-01-07 DIAGNOSIS — Z13220 Encounter for screening for lipoid disorders: Secondary | ICD-10-CM | POA: Diagnosis not present

## 2017-01-07 DIAGNOSIS — Z23 Encounter for immunization: Secondary | ICD-10-CM

## 2017-01-07 DIAGNOSIS — Z1211 Encounter for screening for malignant neoplasm of colon: Secondary | ICD-10-CM | POA: Diagnosis not present

## 2017-01-07 MED ORDER — MUPIROCIN 2 % EX OINT: 1.0000 "application " | TOPICAL_OINTMENT | Freq: Two times a day (BID) | CUTANEOUS | 0 refills | Status: DC

## 2017-01-07 MED ORDER — DOXYCYCLINE HYCLATE 100 MG PO TABS: 100.0000 mg | ORAL_TABLET | Freq: Two times a day (BID) | ORAL | 0 refills | Status: DC

## 2017-01-07 NOTE — Progress Notes (Signed)
   Subjective:    Patient ID: Luis Warner, male    DOB: 07/18/1950, 66 y.o.   MRN: [id number]  Cough  This is a new problem. The current episode started in the past 7 days. Associated symptoms include nasal congestion and rhinorrhea. Pertinent negatives include no chest pain, ear pain, fever or wheezing.   Patient with head congestion drainage coughing sinus pressure not feeling good denies high fever chills sweats  Having difficulty with memory but this is been a chronic issue since a head injury he is and he is due for a wellness exam  He would like referral for colonoscopy.  He also relates an area on the end of his penis that he tried triple antibiotic ointment and then he tried a steroid cream for it neither seem to get totally better   Review of Systems  Constitutional: Negative for activity change and fever.  HENT: Positive for congestion and rhinorrhea. Negative for ear pain.   Eyes: Negative for discharge.  Respiratory: Positive for cough. Negative for wheezing.   Cardiovascular: Negative for chest pain.       Objective:   Physical Exam  Constitutional: He appears well-developed.  HENT:  Head: Normocephalic.  Mouth/Throat: Oropharynx is clear and moist. No oropharyngeal exudate.  Neck: Normal range of motion.  Cardiovascular: Normal rate, regular rhythm and normal heart sounds.  No murmur heard. Pulmonary/Chest: Effort normal and breath sounds normal. He has no wheezes.  Lymphadenopathy:    He has no cervical adenopathy.  Neurological: He exhibits normal muscle tone.  Skin: Skin is warm and dry.  Nursing note and vitals reviewed.   The head of the penis has a erythematous area does not appear to be cancer I think it is reasonable to treat with Bactroban if it does not get better without then referral to dermatology  All of these instructions were written down for the patient    Assessment & Plan:  Viral-like illness Secondary rhinosinusitis Antibiotics  prescribed for the next week  Wellness labs ordered  Patient with significant cognitive difficulties related to accident I doubt he has Alzheimer's he will follow-up in the near future for a physical his wife will come we will do a wellness plus also discussed how he is doing currently he is being followed by a specialist for this area MRI from 2016 reviewed  Patient also has an area on his penile head that I recommend treating with Bactroban ointment twice daily over the next few weeks if that does not get better I have recommended for the patient to see Dr. Nevada Crane dermatology it does not appear to be cancer  Flu shot today  Referral for colonoscopy patient is due

## 2017-01-07 NOTE — Patient Instructions (Signed)
Steps to Quit Smoking Smoking tobacco can be bad for your health. It can also affect almost every organ in your body. Smoking puts you and people around you at risk for many serious long-lasting (chronic) diseases. Quitting smoking is hard, but it is one of the best things that you can do for your health. It is never too late to quit. What are the benefits of quitting smoking? When you quit smoking, you lower your risk for getting serious diseases and conditions. They can include:  Lung cancer or lung disease.  Heart disease.  Stroke.  Heart attack.  Not being able to have children (infertility).  Weak bones (osteoporosis) and broken bones (fractures).  If you have coughing, wheezing, and shortness of breath, those symptoms may get better when you quit. You may also get sick less often. If you are pregnant, quitting smoking can help to lower your chances of having a baby of low birth weight. What can I do to help me quit smoking? Talk with your doctor about what can help you quit smoking. Some things you can do (strategies) include:  Quitting smoking totally, instead of slowly cutting back how much you smoke over a period of time.  Going to in-person counseling. You are more likely to quit if you go to many counseling sessions.  Using resources and support systems, such as: ? Online chats with a counselor. ? Phone quitlines. ? Printed self-help materials. ? Support groups or group counseling. ? Text messaging programs. ? Mobile phone apps or applications.  Taking medicines. Some of these medicines may have nicotine in them. If you are pregnant or breastfeeding, do not take any medicines to quit smoking unless your doctor says it is okay. Talk with your doctor about counseling or other things that can help you.  Talk with your doctor about using more than one strategy at the same time, such as taking medicines while you are also going to in-person counseling. This can help make  quitting easier. What things can I do to make it easier to quit? Quitting smoking might feel very hard at first, but there is a lot that you can do to make it easier. Take these steps:  Talk to your family and friends. Ask them to support and encourage you.  Call phone quitlines, reach out to support groups, or work with a counselor.  Ask people who smoke to not smoke around you.  Avoid places that make you want (trigger) to smoke, such as: ? Bars. ? Parties. ? Smoke-break areas at work.  Spend time with people who do not smoke.  Lower the stress in your life. Stress can make you want to smoke. Try these things to help your stress: ? Getting regular exercise. ? Deep-breathing exercises. ? Yoga. ? Meditating. ? Doing a body scan. To do this, close your eyes, focus on one area of your body at a time from head to toe, and notice which parts of your body are tense. Try to relax the muscles in those areas.  Download or buy apps on your mobile phone or tablet that can help you stick to your quit plan. There are many free apps, such as QuitGuide from the CDC (Centers for Disease Control and Prevention). You can find more support from smokefree.gov and other websites.  This information is not intended to replace advice given to you by your health care provider. Make sure you discuss any questions you have with your health care provider. Document Released: 11/23/2008 Document   Revised: 09/25/2015 Document Reviewed: 06/13/2014 Elsevier Interactive Patient Education  2018 [employer]vier Inc.  

## 2017-01-10 LAB — HEPATIC FUNCTION PANEL
ALBUMIN: 3.8 g/dL (ref 3.6–4.8)
ALT: 12 IU/L (ref 0–44)
AST: 14 IU/L (ref 0–40)
Alkaline Phosphatase: 93 IU/L (ref 39–117)
BILIRUBIN TOTAL: 0.2 mg/dL (ref 0.0–1.2)
BILIRUBIN, DIRECT: 0.07 mg/dL (ref 0.00–0.40)
TOTAL PROTEIN: 6.3 g/dL (ref 6.0–8.5)

## 2017-01-10 LAB — LIPID PANEL
CHOL/HDL RATIO: 3.3 ratio (ref 0.0–5.0)
Cholesterol, Total: 151 mg/dL (ref 100–199)
HDL: 46 mg/dL (ref >39)
LDL CALC: 95 mg/dL (ref 0–99)
TRIGLYCERIDES: 50 mg/dL (ref 0–149)
VLDL Cholesterol Cal: 10 mg/dL (ref 5–40)

## 2017-01-10 LAB — BASIC METABOLIC PANEL
BUN / CREAT RATIO: 17 (ref 10–24)
BUN: 15 mg/dL (ref 8–27)
CALCIUM: 9.1 mg/dL (ref 8.6–10.2)
CHLORIDE: 106 mmol/L (ref 96–106)
CO2: 23 mmol/L (ref 20–29)
CREATININE: 0.89 mg/dL (ref 0.76–1.27)
GFR, EST AFRICAN AMERICAN: 102 mL/min/{1.73_m2} (ref >59)
GFR, EST NON AFRICAN AMERICAN: 88 mL/min/{1.73_m2} (ref >59)
Glucose: 95 mg/dL (ref 65–99)
Potassium: 4.4 mmol/L (ref 3.5–5.2)
Sodium: 146 mmol/L — ABNORMAL HIGH (ref 134–144)

## 2017-01-10 LAB — PSA: Prostate Specific Ag, Serum: 0.8 ng/mL (ref 0.0–4.0)

## 2017-01-13 ENCOUNTER — Encounter: Payer: Self-pay | Admitting: Family Medicine

## 2017-01-14 ENCOUNTER — Encounter (INDEPENDENT_AMBULATORY_CARE_PROVIDER_SITE_OTHER): Payer: Self-pay | Admitting: *Deleted

## 2017-01-20 ENCOUNTER — Encounter: Payer: Self-pay | Admitting: Family Medicine

## 2017-01-20 ENCOUNTER — Ambulatory Visit (INDEPENDENT_AMBULATORY_CARE_PROVIDER_SITE_OTHER): Payer: Commercial Managed Care - PPO | Admitting: Family Medicine

## 2017-01-20 VITALS — BP 160/88 | Temp 97.6°F | Ht 70.0 in | Wt 181.0 lb

## 2017-01-20 DIAGNOSIS — M7918 Myalgia, other site: Secondary | ICD-10-CM | POA: Diagnosis not present

## 2017-01-20 MED ORDER — IBUPROFEN 600 MG PO TABS: 600.0000 mg | ORAL_TABLET | Freq: Three times a day (TID) | ORAL | 0 refills | Status: DC | PRN

## 2017-01-20 NOTE — Progress Notes (Signed)
   Subjective:    Patient ID: Luis Warner, male    DOB: 1950/03/22, 66 y.o.   MRN: [id number]  HPI Patient is here for left arm pain since having the flu shot here over a week ago. He has been taking tylenol for it, it seems to help some. He relates left deltoid pain present since having an injection for flu shot.  Relates soreness pain discomfort hurts with certain movements denies swelling redness drainage  Review of Systems    Please see above Objective:   Physical Exam Neck upper torso normal left deltoid subjective discomfort no redness no swelling no drainage noted       Assessment & Plan:  I believe this is inflammation from where he got the shot I do not feel he has a broken needle or anything serious like that going on in the shoulder I believe that this will gradually get better with ibuprofen he should follow-up if ongoing troubles otherwise keep his wellness checkup coming up next week

## 2017-01-21 DIAGNOSIS — F331 Major depressive disorder, recurrent, moderate: Secondary | ICD-10-CM | POA: Diagnosis not present

## 2017-01-27 ENCOUNTER — Ambulatory Visit (INDEPENDENT_AMBULATORY_CARE_PROVIDER_SITE_OTHER): Payer: Commercial Managed Care - PPO | Admitting: Family Medicine

## 2017-01-27 ENCOUNTER — Encounter: Payer: Self-pay | Admitting: Family Medicine

## 2017-01-27 VITALS — BP 132/84 | Ht 70.0 in | Wt 179.0 lb

## 2017-01-27 DIAGNOSIS — Z0001 Encounter for general adult medical examination with abnormal findings: Secondary | ICD-10-CM

## 2017-01-27 DIAGNOSIS — R413 Other amnesia: Secondary | ICD-10-CM

## 2017-01-27 DIAGNOSIS — Z0000 Encounter for general adult medical examination without abnormal findings: Secondary | ICD-10-CM

## 2017-01-27 NOTE — Addendum Note (Signed)
Addended by: Dairl Ponder on: 01/27/2017 01:43 PM   Modules accepted: Orders
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
## 2017-01-27 NOTE — Patient Instructions (Signed)
Overall your physical exam is good  Your prostate exam is normal  Your lab work overall looks good.  You are due for a Prevnar 13 vaccine to protect against pneumonia.  I also recommend that you look into shingles vaccine- Shingrix-if you are interested in this vaccine it is available at your pharmacy.  Another wellness exam in 1 year is recommended

## 2017-01-29 ENCOUNTER — Encounter: Payer: Self-pay | Admitting: Family Medicine

## 2017-02-13 ENCOUNTER — Ambulatory Visit (INDEPENDENT_AMBULATORY_CARE_PROVIDER_SITE_OTHER): Payer: Commercial Managed Care - PPO | Admitting: Urology

## 2017-02-13 ENCOUNTER — Other Ambulatory Visit (HOSPITAL_COMMUNITY)
Admission: AD | Admit: 2017-02-13 | Discharge: 2017-02-13 | Disposition: A | Discharge status: Home / Self Care | Payer: Commercial Managed Care - PPO | Source: Other Acute Inpatient Hospital | Attending: Urology | Admitting: Urology

## 2017-02-13 DIAGNOSIS — N401 Enlarged prostate with lower urinary tract symptoms: Secondary | ICD-10-CM | POA: Diagnosis not present

## 2017-02-13 DIAGNOSIS — R3915 Urgency of urination: Secondary | ICD-10-CM

## 2017-02-13 DIAGNOSIS — N3281 Overactive bladder: Secondary | ICD-10-CM | POA: Diagnosis not present

## 2017-02-13 DIAGNOSIS — N5201 Erectile dysfunction due to arterial insufficiency: Secondary | ICD-10-CM | POA: Diagnosis not present

## 2017-02-13 LAB — URINALYSIS, COMPLETE (UACMP) WITH MICROSCOPIC
BILIRUBIN URINE: NEGATIVE
Bacteria, UA: NONE SEEN
GLUCOSE, UA: NEGATIVE mg/dL
HGB URINE DIPSTICK: NEGATIVE
KETONES UR: NEGATIVE mg/dL
LEUKOCYTES UA: NEGATIVE
NITRITE: NEGATIVE
PROTEIN: NEGATIVE mg/dL
Specific Gravity, Urine: 1.019 (ref 1.005–1.030)
Squamous Epithelial / LPF: NONE SEEN
pH: 6 (ref 5.0–8.0)

## 2017-02-15 LAB — URINE CULTURE: Culture: NO GROWTH

## 2017-02-15 IMAGING — MR MR HEAD W/O CM
10 of 11 series · 39 of 48 positions shown · non-contrast
Comparison: Head CT 11/06/2009 and MRI 10/10/2009

CLINICAL DATA: Short-term memory loss, cognitive dysfunction,
traumatic brain injury, dizziness, weakness, and bilateral hearing
loss.

EXAM:
MRI HEAD WITHOUT CONTRAST
TECHNIQUE: Multiplanar, multiecho pulse sequences of the brain and surrounding
structures were obtained without intravenous contrast.

[Series 2: [id number] · sagittal · 5.0mm · 0.45mm/px · 1 of 20 slices shown]
[im 1/20]
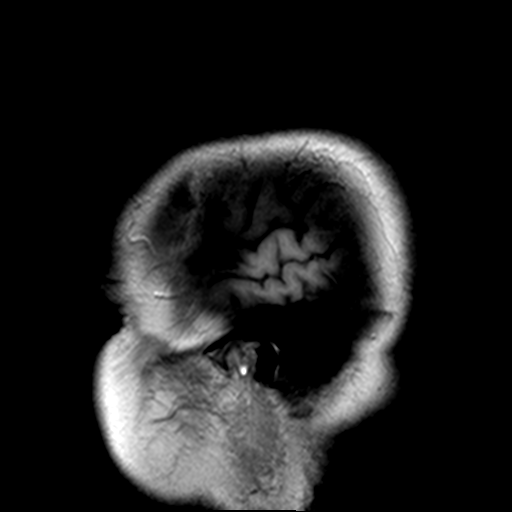

[Series 5: T2 · axial · 5.0mm · 0.51mm/px · z∈[-102,+41]mm · 2 of 23 slices shown (1 of 3)]
[im 1/23]
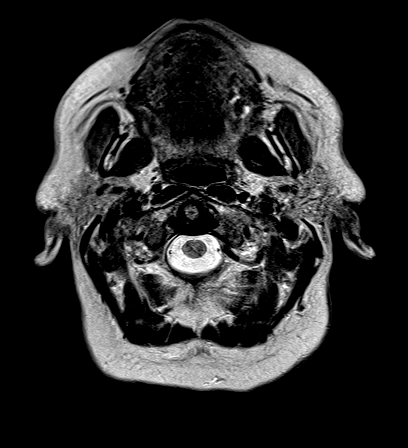
[im 23/23]
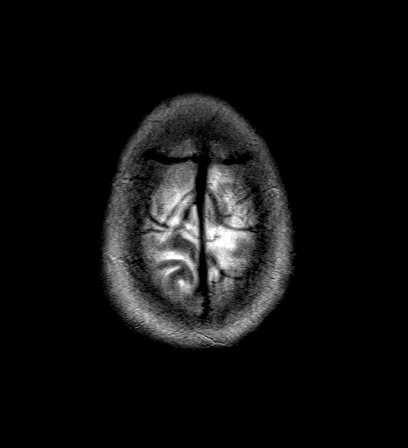

[Series 6: FLAIR · axial · 5.0mm · 0.94mm/px · z∈[-102,+41]mm · 3 of 23 slices shown]
[im 1/23]
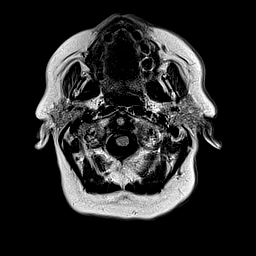
[im 12/23]
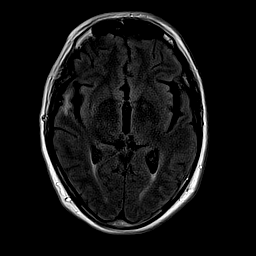
[im 23/23]
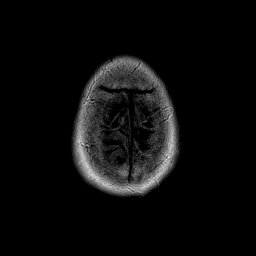

[Series 7: T1 · axial · 2.0mm · 0.45mm/px · z∈[-111,+73]mm · 8 of 93 slices shown]
[im 1/93]
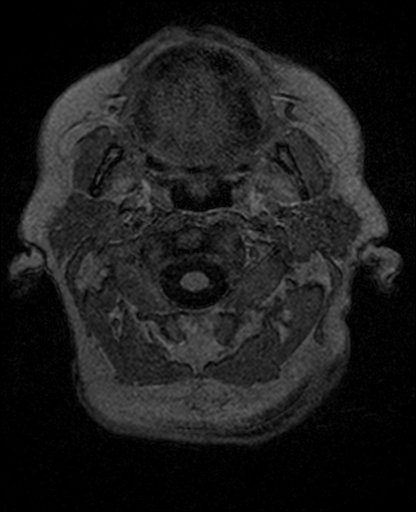
[im 11/93]
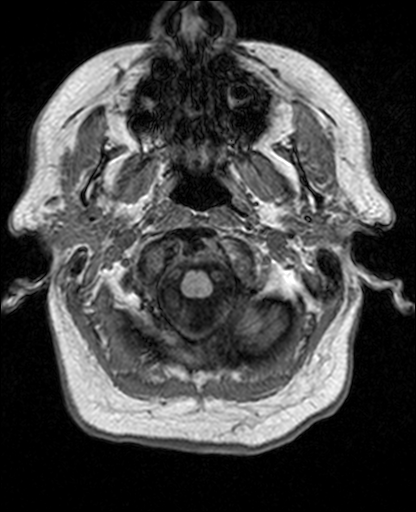
[im 31/93]
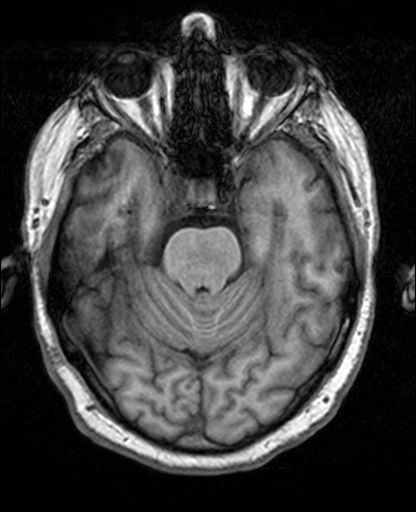
[im 41/93]
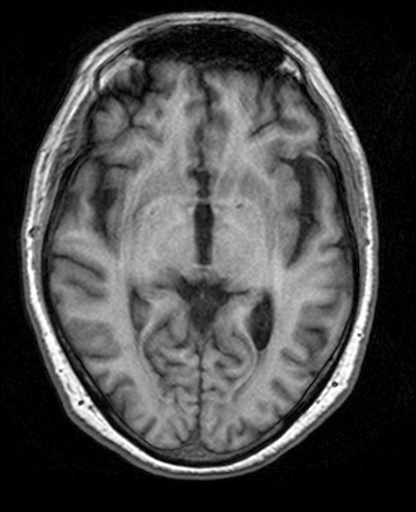
[im 52/93]
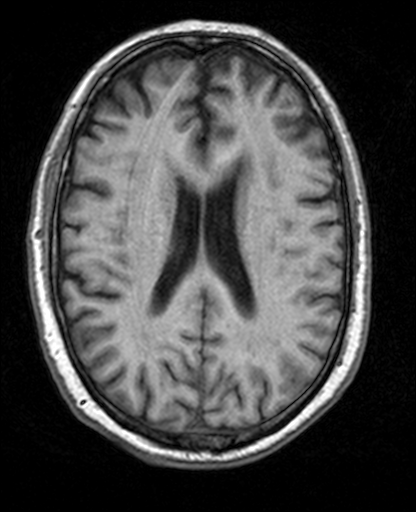
[im 62/93]
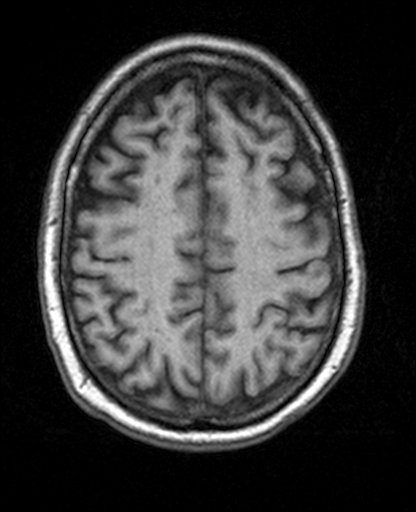
[im 82/93]
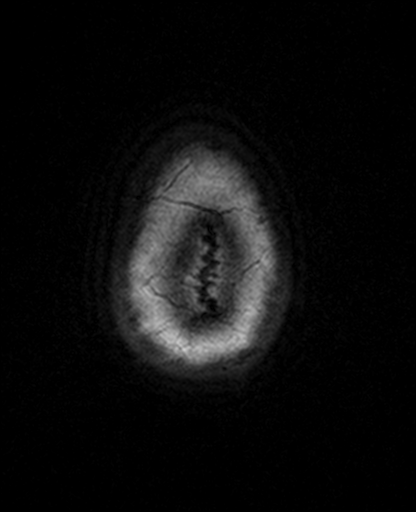
[im 93/93]
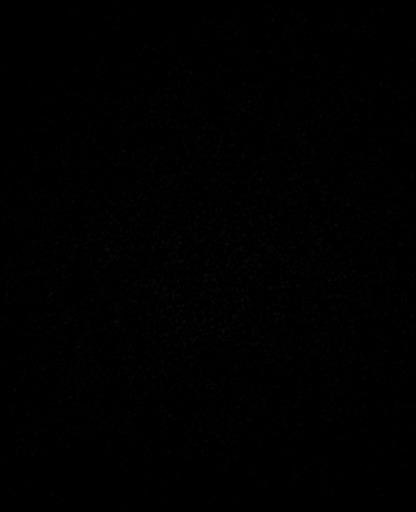

[Series 8: trauma axial · axial · 5.0mm · 0.45mm/px · z∈[-95,+34]mm · 2 of 21 slices shown]
[im 1/21]
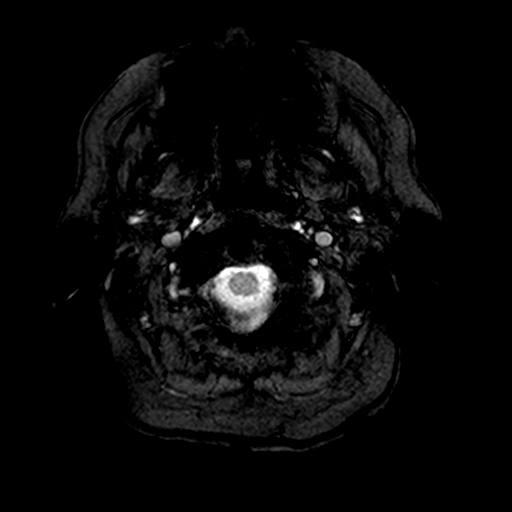
[im 21/21]
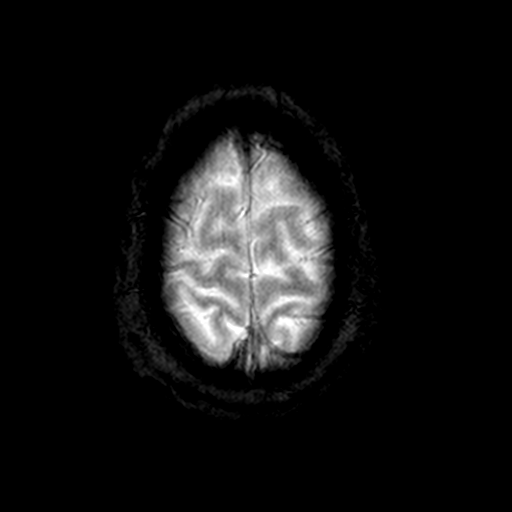

[Series 9: T2 · coronal · 5.0mm · 0.63mm/px · 3 of 24 slices shown (2 of 3)]
[im 1/24]
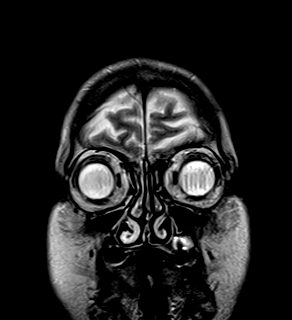
[im 12/24]
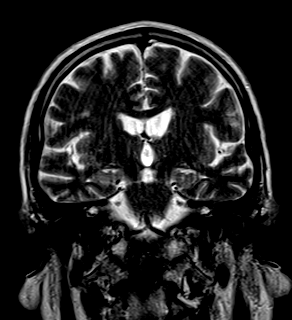
[im 24/24]
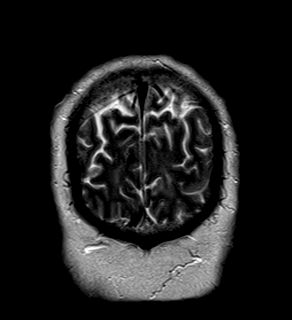

[Series 10: T2 · axial · 5.0mm · 0.75mm/px · z∈[-102,+41]mm · 3 of 23 slices shown (3 of 3)]
[im 1/23]
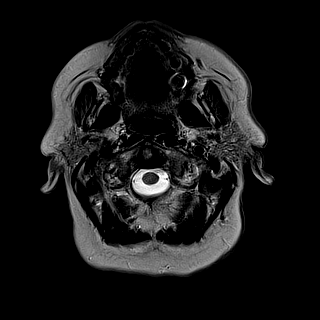
[im 12/23]
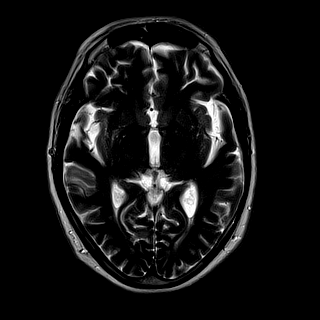
[im 23/23]
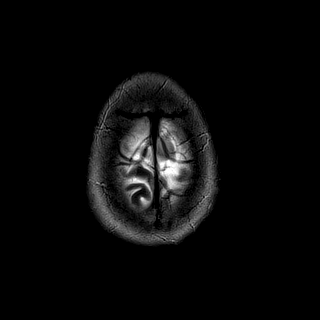

[Series 100: <mpr thick range> · axial · 3.0mm · 0.82mm/px · z∈[-92,+43]mm · 5 of 46 slices shown]
[im 1/46]
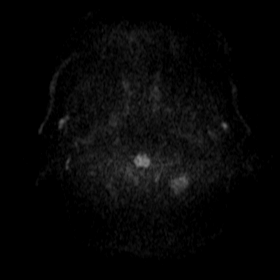
[im 12/46]
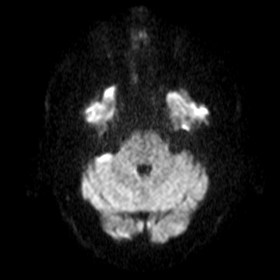
[im 23/46]
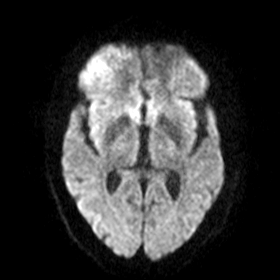
[im 34/46]
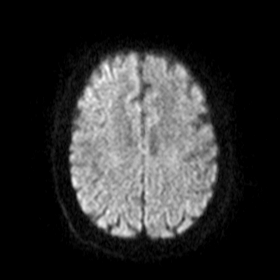
[im 46/46]
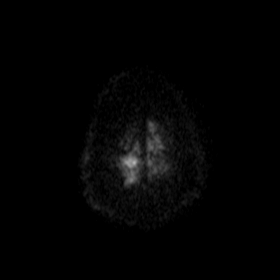

[Series 101: <mpr thick range(1)> · coronal · 3.0mm · 0.82mm/px · 7 of 60 slices shown]
[im 1/60]
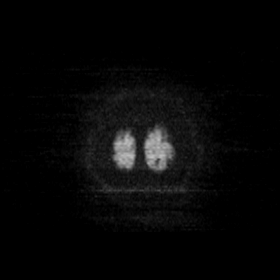
[im 10/60]
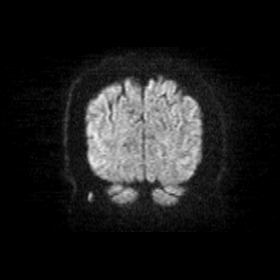
[im 20/60]
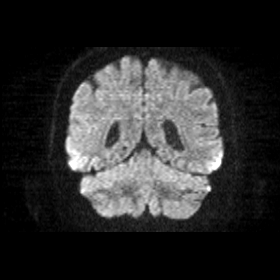
[im 30/60]
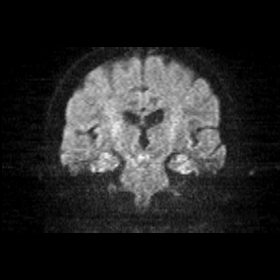
[im 40/60]
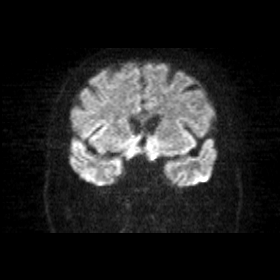
[im 50/60]
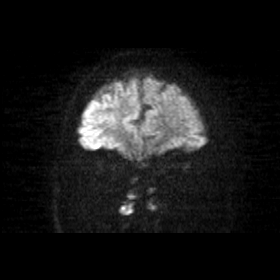
[im 60/60]
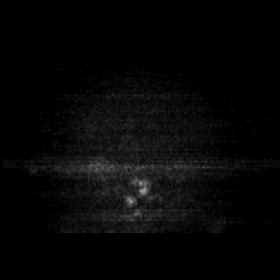

[Series 102: <mpr thick range(2)> · axial · 3.0mm · 0.82mm/px · z∈[-92,+43]mm · 5 of 46 slices shown]
[im 1/46]
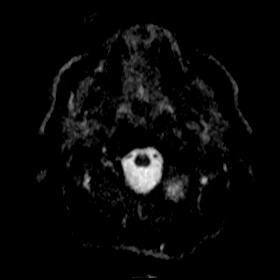
[im 12/46]
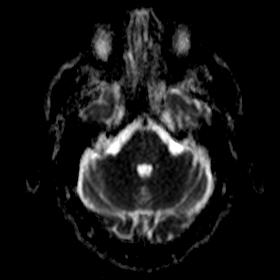
[im 23/46]
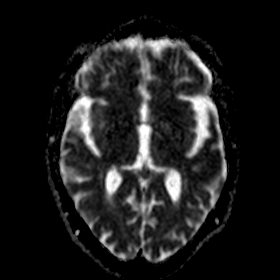
[im 34/46]
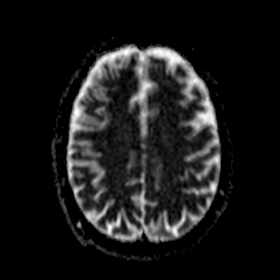
[im 46/46]
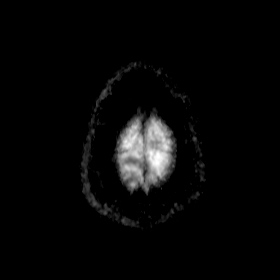

[39 of 48 positions shown; findings below may reference images not displayed]

FINDINGS: There is no evidence of acute infarct, intracranial hemorrhage,
mass, midline shift, or extra-axial fluid collection. Ventricles are
within normal limits for age. Mild encephalomalacia is present in
the right greater than left temporal lobes and likely related to
prior trauma. Small foci of T2 hyperintensity scattered elsewhere in
the subcortical and deep cerebral white matter have minimally
progressed from the prior MRI and are nonspecific but compatible
with mild chronic small vessel ischemic disease.

Orbits are unremarkable. Mild left sphenoid sinus mucosal thickening
is noted. Mastoid air cells are clear. Major intracranial vascular
flow voids are preserved.
IMPRESSION: 1. No acute intracranial abnormality or mass.
2. Mild encephalomalacia in the right greater than left temporal
lobes consistent with prior trauma.
3. Mild chronic small vessel ischemic disease, slightly progressed

## 2017-02-23 DIAGNOSIS — L400 Psoriasis vulgaris: Secondary | ICD-10-CM | POA: Diagnosis not present

## 2017-02-23 DIAGNOSIS — D044 Carcinoma in situ of skin of scalp and neck: Secondary | ICD-10-CM | POA: Diagnosis not present

## 2017-03-25 DIAGNOSIS — F331 Major depressive disorder, recurrent, moderate: Secondary | ICD-10-CM | POA: Diagnosis not present

## 2017-04-01 ENCOUNTER — Ambulatory Visit (INDEPENDENT_AMBULATORY_CARE_PROVIDER_SITE_OTHER): Payer: Commercial Managed Care - PPO | Admitting: Family Medicine

## 2017-04-01 ENCOUNTER — Encounter: Payer: Self-pay | Admitting: Family Medicine

## 2017-04-01 VITALS — BP 118/72 | Ht 70.0 in | Wt 181.0 lb

## 2017-04-01 DIAGNOSIS — F988 Other specified behavioral and emotional disorders with onset usually occurring in childhood and adolescence: Secondary | ICD-10-CM | POA: Diagnosis not present

## 2017-04-01 NOTE — Patient Instructions (Addendum)
Your blood pressure is good.  Eat healthy and stay active.    DASH Eating Plan DASH stands for "Dietary Approaches to Stop Hypertension." The DASH eating plan is a healthy eating plan that has been shown to reduce high blood pressure (hypertension). It may also reduce your risk for type 2 diabetes, heart disease, and stroke. The DASH eating plan may also help with weight loss. What are tips for following this plan? General guidelines  Avoid eating more than 2,300 mg (milligrams) of salt (sodium) a day. If you have hypertension, you may need to reduce your sodium intake to 1,500 mg a day.  Limit alcohol intake to no more than 1 drink a day for nonpregnant women and 2 drinks a day for men. One drink equals 12 oz of beer, 5 oz of wine, or 1 oz of hard liquor.  Work with your health care provider to maintain a healthy body weight or to lose weight. Ask what an ideal weight is for you.  Get at least 30 minutes of exercise that causes your heart to beat faster (aerobic exercise) most days of the week. Activities may include walking, swimming, or biking.  Work with your health care provider or diet and nutrition specialist (dietitian) to adjust your eating plan to your individual calorie needs. Reading food labels  Check food labels for the amount of sodium per serving. Choose foods with less than 5 percent of the Daily Value of sodium. Generally, foods with less than 300 mg of sodium per serving fit into this eating plan.  To find whole grains, look for the word "whole" as the first word in the ingredient list. Shopping  Buy products labeled as "low-sodium" or "no salt added."  Buy fresh foods. Avoid canned foods and premade or frozen meals. Cooking  Avoid adding salt when cooking. Use salt-free seasonings or herbs instead of table salt or sea salt. Check with your health care provider or pharmacist before using salt substitutes.  Do not fry foods. Cook foods using healthy methods such  as baking, boiling, grilling, and broiling instead.  Cook with heart-healthy oils, such as olive, canola, soybean, or sunflower oil. Meal planning   Eat a balanced diet that includes: ? 5 or more servings of fruits and vegetables each day. At each meal, try to fill half of your plate with fruits and vegetables. ? Up to 6-8 servings of whole grains each day. ? Less than 6 oz of lean meat, poultry, or fish each day. A 3-oz serving of meat is about the same size as a deck of cards. One egg equals 1 oz. ? 2 servings of low-fat dairy each day. ? A serving of nuts, seeds, or beans 5 times each week. ? Heart-healthy fats. Healthy fats called Omega-3 fatty acids are found in foods such as flaxseeds and coldwater fish, like sardines, salmon, and mackerel.  Limit how much you eat of the following: ? Canned or prepackaged foods. ? Food that is high in trans fat, such as fried foods. ? Food that is high in saturated fat, such as fatty meat. ? Sweets, desserts, sugary drinks, and other foods with added sugar. ? Full-fat dairy products.  Do not salt foods before eating.  Try to eat at least 2 vegetarian meals each week.  Eat more home-cooked food and less restaurant, buffet, and fast food.  When eating at a restaurant, ask that your food be prepared with less salt or no salt, if possible. What foods are recommended? The  items listed may not be a complete list. Talk with your dietitian about what dietary choices are best for you. Grains Whole-grain or whole-wheat bread. Whole-grain or whole-wheat pasta. Brown rice. [place]. Bulgur. Whole-grain and low-sodium cereals. Pita bread. Low-fat, low-sodium crackers. Whole-wheat flour tortillas. Vegetables Fresh or frozen vegetables (raw, steamed, roasted, or grilled). Low-sodium or reduced-sodium tomato and vegetable juice. Low-sodium or reduced-sodium tomato sauce and tomato paste. Low-sodium or reduced-sodium canned vegetables. Fruits All  fresh, dried, or frozen fruit. Canned fruit in natural juice (without added sugar). Meat and other protein foods Skinless chicken or [place]. Ground chicken or [place]. Pork with fat trimmed off. Fish and seafood. Egg whites. Dried beans, peas, or lentils. Unsalted nuts, nut butters, and seeds. Unsalted canned beans. Lean cuts of beef with fat trimmed off. Low-sodium, lean deli meat. Dairy Low-fat (1%) or fat-free (skim) milk. Fat-free, low-fat, or reduced-fat cheeses. Nonfat, low-sodium ricotta or cottage cheese. Low-fat or nonfat yogurt. Low-fat, low-sodium cheese. Fats and oils Soft margarine without trans fats. Vegetable oil. Low-fat, reduced-fat, or light mayonnaise and salad dressings (reduced-sodium). Canola, safflower, olive, soybean, and sunflower oils. Avocado. Seasoning and other foods Herbs. Spices. Seasoning mixes without salt. Unsalted popcorn and pretzels. Fat-free sweets. What foods are not recommended? The items listed may not be a complete list. Talk with your dietitian about what dietary choices are best for you. Grains Baked goods made with fat, such as croissants, muffins, or some breads. Dry pasta or rice meal packs. Vegetables Creamed or fried vegetables. Vegetables in a cheese sauce. Regular canned vegetables (not low-sodium or reduced-sodium). Regular canned tomato sauce and paste (not low-sodium or reduced-sodium). Regular tomato and vegetable juice (not low-sodium or reduced-sodium). Angie Fava. Olives. Fruits Canned fruit in a light or heavy syrup. Fried fruit. Fruit in cream or butter sauce. Meat and other protein foods Fatty cuts of meat. Ribs. Fried meat. Berniece Salines. Sausage. Bologna and other processed lunch meats. Salami. Fatback. Hotdogs. Bratwurst. Salted nuts and seeds. Canned beans with added salt. Canned or smoked fish. Whole eggs or egg yolks. Chicken or [place] with skin. Dairy Whole or 2% milk, cream, and half-and-half. Whole or full-fat cream cheese. Whole-fat or  sweetened yogurt. Full-fat cheese. Nondairy creamers. Whipped toppings. Processed cheese and cheese spreads. Fats and oils Butter. Stick margarine. Lard. Shortening. Ghee. Bacon fat. Tropical oils, such as coconut, palm kernel, or palm oil. Seasoning and other foods Salted popcorn and pretzels. Onion salt, garlic salt, seasoned salt, table salt, and sea salt. Worcestershire sauce. Tartar sauce. Barbecue sauce. Teriyaki sauce. Soy sauce, including reduced-sodium. Steak sauce. Canned and packaged gravies. Fish sauce. Oyster sauce. Cocktail sauce. Horseradish that you find on the shelf. Ketchup. Mustard. Meat flavorings and tenderizers. Bouillon cubes. Hot sauce and Tabasco sauce. Premade or packaged marinades. Premade or packaged taco seasonings. Relishes. Regular salad dressings. Where to find more information:  National Heart, Lung, and [place]: https://wilson-eaton.com/  American Heart Association: www.heart.org Summary  The DASH eating plan is a healthy eating plan that has been shown to reduce high blood pressure (hypertension). It may also reduce your risk for type 2 diabetes, heart disease, and stroke.  With the DASH eating plan, you should limit salt (sodium) intake to 2,300 mg a day. If you have hypertension, you may need to reduce your sodium intake to 1,500 mg a day.  When on the DASH eating plan, aim to eat more fresh fruits and vegetables, whole grains, lean proteins, low-fat dairy, and heart-healthy fats.  Work with your health care provider  or diet and nutrition specialist (dietitian) to adjust your eating plan to your individual calorie needs. This information is not intended to replace advice given to you by your health care provider. Make sure you discuss any questions you have with your health care provider. Document Released: 01/16/2011 Document Revised: 01/21/2016 Document Reviewed: 01/21/2016 Elsevier Interactive Patient Education  [employer].

## 2017-04-01 NOTE — Progress Notes (Addendum)
   Subjective:    Patient ID: Luis Warner, male    DOB: [birth year], 67 y.o.   MRN: [id number]  HPI  Patient arrives to discuss elevated blood pressure at psych. Psych held Adderall is helps to see if it helps decrease blood pressure.  Patient states that he has taken ADD medicine because blood pressure go up the back down on his dose he is here today for follow-up on blood pressure denies any chest tightness pressure pain shortness of breath.  No prior history of blood pressure issues.  Does have some memory issues will be seen specialist coming up in March. Review of Systems  Constitutional: Negative for activity change, appetite change and fatigue.  HENT: Negative for congestion and rhinorrhea.   Respiratory: Negative for cough, chest tightness and shortness of breath.   Cardiovascular: Negative for chest pain and leg swelling.  Gastrointestinal: Negative for abdominal pain, diarrhea and nausea.  Endocrine: Negative for polydipsia and polyphagia.  Genitourinary: Negative for dysuria and hematuria.  Neurological: Negative for weakness and headaches.  Psychiatric/Behavioral: Negative for confusion and dysphoric mood.       Objective:   Physical Exam  Constitutional: He appears well-nourished. No distress.  HENT:  Head: Normocephalic and atraumatic.  Eyes: Right eye exhibits no discharge. Left eye exhibits no discharge.  Neck: No tracheal deviation present.  Cardiovascular: Normal rate, regular rhythm and normal heart sounds.  No murmur heard. Pulmonary/Chest: Effort normal and breath sounds normal. No respiratory distress. He has no wheezes.  Musculoskeletal: He exhibits no edema.  Lymphadenopathy:    He has no cervical adenopathy.  Neurological: He is alert.  Skin: Skin is warm. No rash noted.  Psychiatric: His behavior is normal.  Vitals reviewed.         Assessment & Plan:  Blood pressure recheck looks good.  Very important for patient to maintain healthy  diet  His afternoon Adderall dose could be of contributing to his blood pressure he will hold this  He will follow-up with his specialist on a regular basis  Patient sees memory specialist regarding his traumatic brain injury and memory issues somewhere in March he will follow-up with [place] after all of that  He will do a wellness exam December.  We will send a copy of this to his psychiatric specialist Dr. Pia Mau.  [address], [place], [place] [address] Fax [phone]

## 2017-04-27 ENCOUNTER — Ambulatory Visit: Payer: Medicare Other | Admitting: Family Medicine

## 2017-05-06 DIAGNOSIS — F331 Major depressive disorder, recurrent, moderate: Secondary | ICD-10-CM | POA: Diagnosis not present

## 2017-06-23 DIAGNOSIS — F331 Major depressive disorder, recurrent, moderate: Secondary | ICD-10-CM | POA: Diagnosis not present

## 2017-06-29 DIAGNOSIS — F0151 Vascular dementia with behavioral disturbance: Secondary | ICD-10-CM | POA: Diagnosis not present

## 2017-06-29 DIAGNOSIS — G4752 REM sleep behavior disorder: Secondary | ICD-10-CM | POA: Diagnosis not present

## 2017-06-29 DIAGNOSIS — R296 Repeated falls: Secondary | ICD-10-CM | POA: Diagnosis not present

## 2017-06-29 DIAGNOSIS — F064 Anxiety disorder due to known physiological condition: Secondary | ICD-10-CM | POA: Diagnosis not present

## 2017-06-29 DIAGNOSIS — F070 Personality change due to known physiological condition: Secondary | ICD-10-CM | POA: Diagnosis not present

## 2017-06-29 DIAGNOSIS — Z87820 Personal history of traumatic brain injury: Secondary | ICD-10-CM | POA: Diagnosis not present

## 2017-07-20 ENCOUNTER — Encounter: Payer: Self-pay | Admitting: Nurse Practitioner

## 2017-07-20 ENCOUNTER — Ambulatory Visit (INDEPENDENT_AMBULATORY_CARE_PROVIDER_SITE_OTHER): Payer: Commercial Managed Care - PPO | Admitting: Nurse Practitioner

## 2017-07-20 VITALS — BP 130/88 | HR 80 | Ht 70.0 in | Wt 178.2 lb

## 2017-07-20 DIAGNOSIS — B9689 Other specified bacterial agents as the cause of diseases classified elsewhere: Secondary | ICD-10-CM | POA: Diagnosis not present

## 2017-07-20 DIAGNOSIS — Z87891 Personal history of nicotine dependence: Secondary | ICD-10-CM

## 2017-07-20 DIAGNOSIS — J209 Acute bronchitis, unspecified: Secondary | ICD-10-CM

## 2017-07-20 DIAGNOSIS — J069 Acute upper respiratory infection, unspecified: Secondary | ICD-10-CM

## 2017-07-20 MED ORDER — DOXYCYCLINE HYCLATE 100 MG PO TABS
100.0000 mg | ORAL_TABLET | Freq: Two times a day (BID) | ORAL | 0 refills | Status: DC
Start: 2017-07-20 — End: 2017-10-19

## 2017-07-21 ENCOUNTER — Encounter: Payer: Self-pay | Admitting: Nurse Practitioner

## 2017-07-21 DIAGNOSIS — F064 Anxiety disorder due to known physiological condition: Secondary | ICD-10-CM | POA: Insufficient documentation

## 2017-07-21 DIAGNOSIS — F0151 Vascular dementia with behavioral disturbance: Secondary | ICD-10-CM | POA: Insufficient documentation

## 2017-07-21 DIAGNOSIS — F070 Personality change due to known physiological condition: Secondary | ICD-10-CM | POA: Insufficient documentation

## 2017-07-21 DIAGNOSIS — F01518 Vascular dementia, unspecified severity, with other behavioral disturbance: Secondary | ICD-10-CM | POA: Insufficient documentation

## 2017-07-21 DIAGNOSIS — G4752 REM sleep behavior disorder: Secondary | ICD-10-CM | POA: Insufficient documentation

## 2017-07-21 NOTE — Progress Notes (Signed)
Subjective: Presents for complaints of a cough and congestion for the past 4 days.  Slight fever which has resolved.  No sore throat.  Frontal area headache.  Head congestion.  Producing slight yellow mucus at times.  No wheezing.  No ear pain.  Patient has smoked 1-1/2 packs/day for over 50 years.  Denies hemoptysis or unexplained weight loss.  Is willing and healthy enough to undergo biopsy or surgery if needed.  Is interested in doing the low-dose CT scan screening for lung cancer.  Objective:   BP 130/88 (BP Location: Left Arm, Patient Position: Sitting)   Pulse 80   Ht 5\' 10"  (1.778 m)   Wt 178 lb 3.2 oz (80.8 kg)   SpO2 96%   BMI 25.57 kg/m  NAD.  Alert, oriented.  TMs clear effusion, no erythema.  Pharynx mildly injected with PND noted.  Neck supple with mild soft anterior adenopathy.  Lungs coarse expiratory crackles noted mainly in the upper lobes posterior and slightly upper lobes anterior.  No wheezing or tachypnea.  Normal color.  O2 sat 96%.  Heart regular rate and rhythm.  Assessment:  Bacterial upper respiratory infection  Acute bronchitis, unspecified organism  Personal history of tobacco use, presenting hazards to health    Plan:   Meds ordered this encounter  Medications  . doxycycline (VIBRA-TABS) 100 MG tablet    Sig: Take 1 tablet (100 mg total) by mouth 2 (two) times daily. With food    Dispense:  20 tablet    Refill:  0    Order Specific Question:   Supervising Provider    Answer:   Mikey Kirschner [[id number]]   Mucinex DM as directed for congestion and cough.  Will refer to discuss low-dose CT scan screening for lung cancer.  Call back by the end of the week if no improvement, sooner if worse.

## 2017-07-23 ENCOUNTER — Telehealth: Payer: Self-pay | Admitting: *Deleted
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
## 2017-07-26 ENCOUNTER — Other Ambulatory Visit: Payer: Self-pay

## 2017-07-26 ENCOUNTER — Emergency Department (HOSPITAL_COMMUNITY)
Admission: EM | Admit: 2017-07-26 | Discharge: 2017-07-26 | Disposition: A | Discharge status: Home / Self Care | Payer: Commercial Managed Care - PPO | Attending: Emergency Medicine | Admitting: Emergency Medicine

## 2017-07-26 ENCOUNTER — Encounter (HOSPITAL_COMMUNITY): Payer: Self-pay | Admitting: Emergency Medicine

## 2017-07-26 ENCOUNTER — Emergency Department (HOSPITAL_COMMUNITY): Payer: Commercial Managed Care - PPO

## 2017-07-26 DIAGNOSIS — F0151 Vascular dementia with behavioral disturbance: Secondary | ICD-10-CM | POA: Insufficient documentation

## 2017-07-26 DIAGNOSIS — J069 Acute upper respiratory infection, unspecified: Secondary | ICD-10-CM | POA: Insufficient documentation

## 2017-07-26 DIAGNOSIS — R062 Wheezing: Secondary | ICD-10-CM | POA: Diagnosis not present

## 2017-07-26 DIAGNOSIS — Z79899 Other long term (current) drug therapy: Secondary | ICD-10-CM | POA: Insufficient documentation

## 2017-07-26 DIAGNOSIS — F17210 Nicotine dependence, cigarettes, uncomplicated: Secondary | ICD-10-CM | POA: Insufficient documentation

## 2017-07-26 DIAGNOSIS — R05 Cough: Secondary | ICD-10-CM | POA: Diagnosis not present

## 2017-07-26 MED ORDER — IPRATROPIUM-ALBUTEROL 0.5-2.5 (3) MG/3ML IN SOLN
3.0000 mL | Freq: Once | RESPIRATORY_TRACT | Status: AC
Start: 2017-07-26 — End: 2017-07-26
  Administered 2017-07-26: 3 mL via RESPIRATORY_TRACT
  Filled 2017-07-26: qty 3

## 2017-07-26 MED ORDER — ALBUTEROL SULFATE HFA 108 (90 BASE) MCG/ACT IN AERS
2.0000 | INHALATION_SPRAY | RESPIRATORY_TRACT | Status: DC
  Administered 2017-07-26: 2 via RESPIRATORY_TRACT

## 2017-07-26 MED ORDER — PREDNISONE 10 MG PO TABS: 10.0000 mg | ORAL_TABLET | Freq: Every day | ORAL | 0 refills | Status: DC

## 2017-07-26 MED ORDER — ALBUTEROL SULFATE HFA 108 (90 BASE) MCG/ACT IN AERS
INHALATION_SPRAY | RESPIRATORY_TRACT | Status: AC
  Filled 2017-07-26: qty 6.7

## 2017-07-26 NOTE — ED Provider Notes (Signed)
[facility] EMERGENCY DEPARTMENT Provider Note   CSN: [id number] Arrival date & time: 07/26/17  1443     History   Chief Complaint Chief Complaint  Patient presents with  . Cough    HPI Luis Warner is a 67 y.o. male.  Level 5 caveat secondary to dementia.  Persistent cough for several days c associated wheezing.  Patient was seen by his primary care doctor on Tuesday and given doxycycline and Mucinex.  His wife has given him her albuterol inhaler to use occasionally.  Wife reports symptoms are worse at night.  No chills or rusty sputum.     History reviewed. No pertinent past medical history.  Patient Active Problem List   Diagnosis Date Noted  . Dementia, multiinfarct, with behavioral disturbance 07/21/2017  . Anxiety disorder due to brain injury 07/21/2017  . 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 Sleep behavior disorder, REM 07/21/2017    Past Surgical History:  Procedure Laterality Date  . skin grafts          Home Medications    Prior to Admission medications   Medication Sig Start Date End Date Taking? Authorizing Provider  amphetamine-dextroamphetamine (ADDERALL XR) 30 MG 24 hr capsule  03/13/14   [provider]  amphetamine-dextroamphetamine (ADDERALL) 5 MG tablet  12/27/13   [provider]  clonazePAM (KLONOPIN) 1 MG tablet  02/28/14   [provider]  doxycycline (VIBRA-TABS) 100 MG tablet Take 1 tablet (100 mg total) by mouth 2 (two) times daily. With food 07/20/17   Nilda Simmer, NP  halobetasol (ULTRAVATE) 0.05 % ointment  07/10/17   [provider]  ibuprofen (ADVIL,MOTRIN) 600 MG tablet Take 1 tablet (600 mg total) by mouth every 8 (eight) hours as needed. 01/20/17   Kathyrn Drown, MD  mupirocin ointment (BACTROBAN) 2 % Apply 1 application topically 2 (two) times daily. 01/07/17   Kathyrn Drown, MD  predniSONE (DELTASONE) 10 MG tablet Take 1 tablet (10 mg total) by mouth daily with breakfast. 3 daily for  3 days, 2 daily for 3 days, 1 daily for 3 days 07/26/17   Nat Christen, MD  sertraline (ZOLOFT) 100 MG tablet  02/27/14   [provider]  tolterodine (DETROL LA) 4 MG 24 hr capsule Take 4 mg by mouth daily.    [provider]    Family History History reviewed. No pertinent family history.  Social History Social History   Tobacco Use  . Smoking status: Current Every Day Smoker    Packs/day: 1.50  . Smokeless tobacco: Never Used  Substance Use Topics  . Alcohol use: Yes    Alcohol/week: 0.0 oz    Comment: occasionally  . Drug use: Never     Allergies   Penicillins   Review of Systems Review of Systems  Unable to perform ROS: Dementia     Physical Exam Updated Vital Signs BP 139/77   Pulse 67   Temp 97.9 F (36.6 C) (Oral)   Resp 20   Ht 5\' 10"  (1.778 m)   Wt 80.7 kg (178 lb)   SpO2 96%   BMI 25.54 kg/m   Physical Exam  Constitutional: He is oriented to person, place, and time.  nad  HENT:  Head: Normocephalic and atraumatic.  Eyes: Conjunctivae are normal.  Neck: Neck supple.  Cardiovascular: Normal rate and regular rhythm.  Pulmonary/Chest: Effort normal.  Minimal expiratory wheeze right greater than left  Abdominal: Soft. Bowel sounds are normal.  Musculoskeletal: Normal range  of motion.  Neurological: He is alert and oriented to person, place, and time.  Skin: Skin is warm and dry.  Psychiatric: He has a normal mood and affect. His behavior is normal.  Nursing note and vitals reviewed.    ED Treatments / Results  Labs (all labs ordered are listed, but only abnormal results are displayed) Labs Reviewed - No data to display  EKG None  Radiology Dg Chest 2 View  Result Date: 07/26/2017 CLINICAL DATA:  Cough for 3-5 days with intermittent fever. EXAM: CHEST - 2 VIEW COMPARISON:  Chest x-ray dated 10/08/2009. FINDINGS: Heart size and mediastinal contours are within normal limits. Lungs are hyperexpanded indicating COPD. Probable  chronic bronchitic changes. No confluent opacity to suggest a developing pneumonia. No pleural effusion or pneumothorax seen. No acute or suspicious osseous finding. IMPRESSION: 1. No acute findings.  No evidence of pneumonia or pulmonary edema. 2. Hyperexpanded lungs suggesting COPD. Probable associated chronic bronchitic changes. Electronically Signed   By: Franki Cabot M.D.   On: 07/26/2017 15:30    Procedures Procedures (including critical care time)  Medications Ordered in ED Medications  albuterol (PROVENTIL HFA;VENTOLIN HFA) 108 (90 Base) MCG/ACT inhaler 2 puff (2 puffs Inhalation Given 07/26/17 1558)  ipratropium-albuterol (DUONEB) 0.5-2.5 (3) MG/3ML nebulizer solution 3 mL (3 mLs Nebulization Given 07/26/17 1549)     Initial Impression / Assessment and Plan / ED Course  I have reviewed the triage vital signs and the nursing notes.  Pertinent labs & imaging results that were available during my care of the patient were reviewed by me and considered in my medical decision making (see chart for details).     Patient presents with persistent cough and wheezing.  He is in no acute distress in the emergency department.  Checks x-ray shows no obvious pneumonia.  He was given a nebulizer treatment in the ED.  Discharge medications albuterol inhaler and prednisone.  Continue antibiotic.  Final Clinical Impressions(s) / ED Diagnoses   Final diagnoses:  Upper respiratory tract infection, unspecified type    ED Discharge Orders        Ordered    predniSONE (DELTASONE) 10 MG tablet  Daily with breakfast     07/26/17 1618       Nat Christen, MD 07/26/17 [phone]

## 2017-07-26 NOTE — Discharge Instructions (Addendum)
Chest x-ray showed no obvious pneumonia.  Usual inhaler every 3-4 hours as needed.  Prescription for prednisone.  Continue antibiotic.

## 2017-07-26 NOTE — ED Triage Notes (Signed)
Pt has had a cough for 3-5 days with intermittent fever, was seen by pcp on Tuesday and given antibx. No improvement.

## 2017-07-26 NOTE — ED Notes (Signed)
Respiratory paged at this time for tx.  

## 2017-07-30 ENCOUNTER — Encounter (INDEPENDENT_AMBULATORY_CARE_PROVIDER_SITE_OTHER): Payer: Self-pay

## 2017-07-30 ENCOUNTER — Encounter: Payer: Self-pay | Admitting: Family Medicine

## 2017-08-05 DIAGNOSIS — F331 Major depressive disorder, recurrent, moderate: Secondary | ICD-10-CM | POA: Diagnosis not present

## 2017-09-15 DIAGNOSIS — F331 Major depressive disorder, recurrent, moderate: Secondary | ICD-10-CM | POA: Diagnosis not present

## 2017-10-19 ENCOUNTER — Encounter (HOSPITAL_COMMUNITY): Payer: Self-pay | Admitting: Emergency Medicine

## 2017-10-19 ENCOUNTER — Emergency Department (HOSPITAL_COMMUNITY)
Admission: EM | Admit: 2017-10-19 | Discharge: 2017-10-20 | Disposition: A | Discharge status: Home / Self Care | Payer: Commercial Managed Care - PPO | Attending: Emergency Medicine | Admitting: Emergency Medicine

## 2017-10-19 ENCOUNTER — Other Ambulatory Visit: Payer: Self-pay

## 2017-10-19 DIAGNOSIS — Y990 Civilian activity done for income or pay: Secondary | ICD-10-CM | POA: Diagnosis not present

## 2017-10-19 DIAGNOSIS — S41012A Laceration without foreign body of left shoulder, initial encounter: Secondary | ICD-10-CM | POA: Insufficient documentation

## 2017-10-19 DIAGNOSIS — F0390 Unspecified dementia without behavioral disturbance: Secondary | ICD-10-CM | POA: Insufficient documentation

## 2017-10-19 DIAGNOSIS — S1081XA Abrasion of other specified part of neck, initial encounter: Secondary | ICD-10-CM | POA: Insufficient documentation

## 2017-10-19 DIAGNOSIS — Z79899 Other long term (current) drug therapy: Secondary | ICD-10-CM | POA: Insufficient documentation

## 2017-10-19 DIAGNOSIS — Z23 Encounter for immunization: Secondary | ICD-10-CM | POA: Insufficient documentation

## 2017-10-19 DIAGNOSIS — Y93H2 Activity, gardening and landscaping: Secondary | ICD-10-CM | POA: Diagnosis not present

## 2017-10-19 DIAGNOSIS — W312XXA Contact with powered woodworking and forming machines, initial encounter: Secondary | ICD-10-CM | POA: Diagnosis not present

## 2017-10-19 DIAGNOSIS — S0101XA Laceration without foreign body of scalp, initial encounter: Secondary | ICD-10-CM | POA: Insufficient documentation

## 2017-10-19 DIAGNOSIS — S1091XA Abrasion of unspecified part of neck, initial encounter: Secondary | ICD-10-CM

## 2017-10-19 DIAGNOSIS — Y929 Unspecified place or not applicable: Secondary | ICD-10-CM | POA: Insufficient documentation

## 2017-10-19 MED ORDER — TETANUS-DIPHTH-ACELL PERTUSSIS 5-2.5-18.5 LF-MCG/0.5 IM SUSP
0.5000 mL | Freq: Once | INTRAMUSCULAR | Status: AC
  Administered 2017-10-19: 0.5 mL via INTRAMUSCULAR
  Filled 2017-10-19: qty 0.5

## 2017-10-19 MED ORDER — LIDOCAINE HCL (PF) 2 % IJ SOLN
10.0000 mL | Freq: Once | INTRAMUSCULAR | Status: AC
  Administered 2017-10-19: 10 mL

## 2017-10-19 MED ORDER — POVIDONE-IODINE 10 % EX SOLN
CUTANEOUS | Status: DC
Start: 2017-10-19 — End: 2017-10-20
  Filled 2017-10-19: qty 15

## 2017-10-19 MED ORDER — LIDOCAINE HCL (PF) 2 % IJ SOLN
INTRAMUSCULAR | Status: AC
  Filled 2017-10-19: qty 20

## 2017-10-19 NOTE — ED Provider Notes (Signed)
[name]   CSN: [id number] Arrival date & time: 10/19/17  1849     History   Chief Complaint Chief Complaint  Patient presents with  . Laceration    HPI Luis Warner is a 67 y.o. male.  Patient is a 67 year old male who presents to the emergency department with a complaint of lacerations. Patient states he was working with a pole saw.  It came down on top of him and hit the side of his head and shoulder.  He sustained a laceration to the scalp and a laceration to the shoulder.  He received some bruising and abrasions to the side of the neck.  There was no loss of consciousness reported.  Patient denies being on any anticoagulation medications.  He presents to the emergency department for assistance with this issue.  Patient is unsure of the date of his last tetanus.    The history is provided by the patient.  Laceration      History reviewed. No pertinent past medical history.  Patient Active Problem List   Diagnosis Date Noted  . Dementia, multiinfarct, with behavioral disturbance 07/21/2017  . Anxiety disorder due to brain injury 07/21/2017  . 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 Sleep behavior disorder, REM 07/21/2017    Past Surgical History:  Procedure Laterality Date  . skin grafts          Home Medications    Prior to Admission medications   Medication Sig Start Date End Date Taking? Authorizing Provider  clonazePAM (KLONOPIN) 1 MG tablet Take 1 mg by mouth at bedtime.  02/28/14  Yes [provider]  halobetasol (ULTRAVATE) 0.05 % ointment Apply 1 application topically daily as needed (for irritation).  07/10/17  Yes [provider]  OLANZapine (ZYPREXA) 5 MG tablet Take 5 mg by mouth at bedtime. 09/29/17  Yes [provider]  sertraline (ZOLOFT) 100 MG tablet Take 200 mg by mouth at bedtime.  02/27/14  Yes [provider]  tolterodine (DETROL LA) 4 MG 24 hr capsule Take 4 mg by  mouth at bedtime.    Yes [provider]    Family History No family history on file.  Social History Social History   Tobacco Use  . Smoking status: Current Every Day Smoker    Packs/day: 1.50  . Smokeless tobacco: Never Used  Substance Use Topics  . Alcohol use: Yes    Alcohol/week: 0.0 standard drinks    Comment: occasionally  . Drug use: Never     Allergies   Penicillins   Review of Systems Review of Systems  Constitutional: Negative for activity change.       All ROS Neg except as noted in HPI  HENT: Negative for nosebleeds.   Eyes: Negative for photophobia and discharge.  Respiratory: Negative for cough, shortness of breath and wheezing.   Cardiovascular: Negative for chest pain and palpitations.  Gastrointestinal: Negative for abdominal pain and blood in stool.  Genitourinary: Negative for dysuria, frequency and hematuria.  Musculoskeletal: Positive for arthralgias. Negative for back pain and neck pain.       Lacerations  Skin: Negative.   Neurological: Negative for dizziness, seizures and speech difficulty.  Psychiatric/Behavioral: Negative for confusion and hallucinations.     Physical Exam Updated Vital Signs BP 138/84   Pulse 66   Temp (!) 97.5 F (36.4 C) (Tympanic)   Resp 18   Ht 5\' 9"  (1.753 m)   Wt 82.6 kg  SpO2 100%   BMI 26.88 kg/m   Physical Exam  Constitutional: He is oriented to person, place, and time. He appears well-developed and well-nourished.  Non-toxic appearance.  HENT:  Head: Normocephalic.    Right Ear: Tympanic membrane and external ear normal.  Left Ear: Tympanic membrane and external ear normal.  Eyes: Pupils are equal, round, and reactive to light. EOM and lids are normal.  Neck: Normal range of motion. Neck supple. Carotid bruit is not present.    Cardiovascular: Normal rate, regular rhythm, normal heart sounds, intact distal pulses and normal pulses.  Pulmonary/Chest: Breath sounds normal. No  respiratory distress.  Abdominal: Soft. Bowel sounds are normal. There is no tenderness. There is no guarding.  Musculoskeletal: Normal range of motion.       Left shoulder: He exhibits laceration.       Arms: Lymphadenopathy:       Head (right side): No submandibular adenopathy present.       Head (left side): No submandibular adenopathy present.    He has no cervical adenopathy.  Neurological: He is alert and oriented to person, place, and time. He has normal strength. No cranial nerve deficit or sensory deficit.  Skin: Skin is warm and dry.  Psychiatric: He has a normal mood and affect. His speech is normal.  Nursing note and vitals reviewed.    ED Treatments / Results  Labs (all labs ordered are listed, but only abnormal results are displayed) Labs Reviewed - No data to display  EKG None  Radiology No results found.  Procedures .Marland KitchenLaceration Repair Date/Time: 10/19/2017 11:56 PM Performed by: Lily Kocher, PA-C Authorized by: Lily Kocher, PA-C   Consent:    Consent obtained:  Verbal   Consent given by:  Patient   Risks discussed:  Pain, poor cosmetic result and poor wound healing Anesthesia (see MAR for exact dosages):    Anesthesia method:  Local infiltration   Local anesthetic:  Lidocaine 2% w/o epi Laceration details:    Location:  Scalp   Scalp location:  L temporal   Length (cm):  1.3 Repair type:    Repair type:  Simple Pre-procedure details:    Preparation:  Patient was prepped and draped in usual sterile fashion Exploration:    Wound exploration: entire depth of wound probed and visualized     Wound extent: no foreign bodies/material noted and no vascular damage noted   Treatment:    Area cleansed with:  Betadine   Irrigation solution:  Sterile saline Skin repair:    Repair method:  Staples   Number of staples:  2 Approximation:    Approximation:  Close Post-procedure details:    Dressing:  Open (no dressing)   Patient tolerance of procedure:   Tolerated well, no immediate complications .Marland KitchenLaceration Repair Date/Time: 10/19/2017 11:59 PM Performed by: Lily Kocher, PA-C Authorized by: Lily Kocher, PA-C   Consent:    Consent obtained:  Verbal   Consent given by:  Patient   Risks discussed:  Infection, pain, poor cosmetic result and poor wound healing Universal protocol:    Procedure explained and questions answered to patient or proxy's satisfaction: yes     Immediately prior to procedure, a time out was called: yes     Patient identity confirmed:  Arm band Anesthesia (see MAR for exact dosages):    Anesthesia method:  Local infiltration   Local anesthetic:  Lidocaine 2% w/o epi Laceration details:    Location:  Shoulder/arm   Shoulder/arm location:  L shoulder  Length (cm):  3.4 Repair type:    Repair type:  Simple Pre-procedure details:    Preparation:  Patient was prepped and draped in usual sterile fashion Exploration:    Wound exploration: entire depth of wound probed and visualized     Wound extent: no foreign bodies/material noted   Treatment:    Area cleansed with:  Betadine   Irrigation solution:  Sterile saline Skin repair:    Repair method:  Staples   Number of staples:  3 Approximation:    Approximation:  Close Post-procedure details:    Dressing:  Sterile dressing   Patient tolerance of procedure:  Tolerated well, no immediate complications   (including critical care time)  Medications Ordered in ED Medications  povidone-iodine (BETADINE) 10 % external solution (has no administration in time range)  Tdap (BOOSTRIX) injection 0.5 mL (has no administration in time range)  lidocaine (XYLOCAINE) 2 % injection 10 mL (has no administration in time range)     Initial Impression / Assessment and Plan / ED Course  I have reviewed the triage vital signs and the nursing notes.  Pertinent labs & imaging results that were available during my care of the patient were reviewed by me and considered in my  medical decision making (see chart for details).       Final Clinical Impressions(s) / ED Diagnoses MDM  Vital signs within normal limits.  Pulse oximetry is 100% on room air.  Within normal limits by my interpretation.  Patient's tetanus status was updated today.  Laceration to the scalp was repaired with 2 staples.  Laceration to the left shoulder was repaired with 11 staples.  Sterile dressing applied.  Discussed the need to return if any signs of advancing infection.  Patient have the staples removed in 7 or 8 days.  He will he will return sooner if any changes in condition, problems, or concerns.   Final diagnoses:  Laceration of left shoulder, initial encounter  Laceration of scalp, initial encounter  Abrasion of neck, initial encounter    ED Discharge Orders    None       Lily Kocher, PA-C 10/21/17 1131    Milton Ferguson, MD 10/21/17 1430

## 2017-10-19 NOTE — ED Triage Notes (Signed)
Pt brought to ER following laceration to left side of head and left shoulder. Pt was operating a pole saw and it came down, hit the side of his head and shoulder. No active bleeding at this time. Pt alert and oriented x4.

## 2017-10-20 NOTE — Discharge Instructions (Addendum)
Your wounds were repaired with staples.  Please have your staples removed in 7 or 8 days.  Please cleanse the area daily with soap and water.  Change the dressing to the wound on your shoulder daily.  Please see your primary physician or return to the emergency department if any signs of advancing infection.  Your tetanus status was updated today.  Please make a notation in your medical record.

## 2017-10-29 ENCOUNTER — Encounter: Payer: Self-pay | Admitting: Family Medicine

## 2017-10-29 ENCOUNTER — Ambulatory Visit (INDEPENDENT_AMBULATORY_CARE_PROVIDER_SITE_OTHER): Payer: Commercial Managed Care - PPO | Admitting: Family Medicine

## 2017-10-29 VITALS — BP 134/78 | Temp 98.1°F | Ht 70.0 in | Wt 186.0 lb

## 2017-10-29 DIAGNOSIS — Z23 Encounter for immunization: Secondary | ICD-10-CM | POA: Diagnosis not present

## 2017-10-29 DIAGNOSIS — L03114 Cellulitis of left upper limb: Secondary | ICD-10-CM

## 2017-10-29 DIAGNOSIS — Z4802 Encounter for removal of sutures: Secondary | ICD-10-CM

## 2017-10-29 MED ORDER — DOXYCYCLINE HYCLATE 100 MG PO TABS
100.0000 mg | ORAL_TABLET | Freq: Two times a day (BID) | ORAL | 0 refills | Status: DC
Start: 2017-10-29 — End: 2017-12-28

## 2017-10-29 NOTE — Progress Notes (Signed)
   Subjective:    Patient ID: Luis Warner, male    DOB: 29-May-1950, 67 y.o.   MRN: [id number]  HPI  Patient is here today for staple removal. He states the staples are located in head and shoulder. He states he was attacked by a pole saw a week ago. The patient relates his accident I reviewed over the ER note He has some pain and discomfort in the left shoulder region some redness denies any drainage Denies any other major issues Is interested in getting this taken care of Review of Systems Denies fever chills sweats denies aching pain discomfort except for soreness around the Left Shoulder,    Objective:   Physical Exam Patient has laceration to top of left shoulder with some associated redness and tenderness consistent with cellulitis no abscess noted also has a scalp  Both of these areas of staples were removed without difficulty a bandage was placed   15 minutes was spent with patient today discussing healthcare issues which they came.  More than 50% of this visit-total duration of visit-was spent in counseling and coordination of care.  Please see diagnosis regarding the focus of this coordination and care       Assessment & Plan:  2 sutures-staples removed without difficulty Infection in the left shoulder region not severe Antibiotic prescribed warning signs discussed Warm compresses frequently

## 2017-10-29 NOTE — Patient Instructions (Addendum)
Your staples were removed  Do a warm compress on the skin of the shoulder for 10 minutes 4 times a day for the next 5 days  Use doxycycline one 2 times a day for 7 days Take with a snack and a tall glass of water  Your due for a wellness in December  If any trouble let me know

## 2017-12-22 DIAGNOSIS — F331 Major depressive disorder, recurrent, moderate: Secondary | ICD-10-CM | POA: Diagnosis not present

## 2017-12-28 ENCOUNTER — Encounter (HOSPITAL_COMMUNITY): Payer: Self-pay | Admitting: Emergency Medicine

## 2017-12-28 ENCOUNTER — Telehealth: Payer: Self-pay

## 2017-12-28 ENCOUNTER — Emergency Department (HOSPITAL_COMMUNITY)
Admission: EM | Admit: 2017-12-28 | Discharge: 2017-12-28 | Disposition: A | Discharge status: Home / Self Care | Payer: Commercial Managed Care - PPO | Attending: Emergency Medicine | Admitting: Emergency Medicine

## 2017-12-28 ENCOUNTER — Emergency Department (HOSPITAL_COMMUNITY): Payer: Commercial Managed Care - PPO

## 2017-12-28 ENCOUNTER — Other Ambulatory Visit: Payer: Self-pay

## 2017-12-28 DIAGNOSIS — J40 Bronchitis, not specified as acute or chronic: Secondary | ICD-10-CM | POA: Insufficient documentation

## 2017-12-28 DIAGNOSIS — F0390 Unspecified dementia without behavioral disturbance: Secondary | ICD-10-CM | POA: Diagnosis not present

## 2017-12-28 DIAGNOSIS — I10 Essential (primary) hypertension: Secondary | ICD-10-CM | POA: Diagnosis not present

## 2017-12-28 DIAGNOSIS — R05 Cough: Secondary | ICD-10-CM | POA: Diagnosis present

## 2017-12-28 DIAGNOSIS — Z79899 Other long term (current) drug therapy: Secondary | ICD-10-CM | POA: Insufficient documentation

## 2017-12-28 DIAGNOSIS — F17210 Nicotine dependence, cigarettes, uncomplicated: Secondary | ICD-10-CM | POA: Diagnosis not present

## 2017-12-28 HISTORY — DX: Essential (primary) hypertension: I10

## 2017-12-28 HISTORY — DX: Unspecified dementia, unspecified severity, without behavioral disturbance, psychotic disturbance, mood disturbance, and anxiety: F03.90

## 2017-12-28 LAB — I-STAT CHEM 8, ED
BUN: 17 mg/dL (ref 8–23)
CALCIUM ION: 1.14 mmol/L — AB (ref 1.15–1.40)
Chloride: 100 mmol/L (ref 98–111)
Creatinine, Ser: 1 mg/dL (ref 0.61–1.24)
Glucose, Bld: 87 mg/dL (ref 70–99)
HCT: 40 % (ref 39.0–52.0)
Hemoglobin: 13.6 g/dL (ref 13.0–17.0)
Potassium: 4 mmol/L (ref 3.5–5.1)
SODIUM: 140 mmol/L (ref 135–145)
TCO2: 30 mmol/L (ref 22–32)

## 2017-12-28 MED ORDER — DOXYCYCLINE HYCLATE 100 MG PO CAPS: 100.0000 mg | ORAL_CAPSULE | Freq: Two times a day (BID) | ORAL | 0 refills | Status: DC

## 2017-12-28 MED ORDER — GUAIFENESIN-DM 100-10 MG/5ML PO SYRP: 10.0000 mL | ORAL_SOLUTION | Freq: Four times a day (QID) | ORAL | 0 refills | Status: DC | PRN

## 2017-12-28 NOTE — ED Triage Notes (Signed)
Patient complaining of cough x 1 week. States he is coughing up white thick sputum.

## 2017-12-28 NOTE — ED Provider Notes (Signed)
[facility] EMERGENCY DEPARTMENT Provider Note   CSN: [id number] Arrival date & time: 12/28/17  1117     History   Chief Complaint Chief Complaint  Patient presents with  . Cough    HPI Luis Warner is a 67 y.o. male.  Patient states she has been coughing for over a week mild sputum production  The history is provided by the patient. No language interpreter was used.  Cough  This is a new problem. The current episode started more than 1 week ago. The problem occurs constantly. The problem has not changed since onset.The cough is productive of sputum. There has been no fever. Pertinent negatives include no chest pain and no headaches. He has tried nothing for the symptoms. The treatment provided no relief. Risk factors: Unknown. He is not a smoker. His past medical history does not include pneumonia.    Past Medical History:  Diagnosis Date  . Dementia ([place])   . Hypertension     Patient Active Problem List   Diagnosis Date Noted  . Dementia, multiinfarct, with behavioral disturbance ([place]) 07/21/2017  . Anxiety disorder due to brain injury 07/21/2017  . 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 Sleep behavior disorder, REM 07/21/2017    Past Surgical History:  Procedure Laterality Date  . skin grafts    . WRIST SURGERY          Home Medications    Prior to Admission medications   Medication Sig Start Date End Date Taking? Authorizing Provider  clonazePAM (KLONOPIN) 1 MG tablet Take 1 mg by mouth at bedtime.  02/28/14   [provider]  doxycycline (VIBRAMYCIN) 100 MG capsule Take 1 capsule (100 mg total) by mouth 2 (two) times daily. One po bid x 7 days 12/28/17   Milton Ferguson, MD  guaiFENesin-dextromethorphan [facility] DM) 100-10 MG/5ML syrup Take 10 mLs by mouth every 6 (six) hours as needed for cough. 12/28/17   Milton Ferguson, MD  halobetasol (ULTRAVATE) 0.05 % ointment Apply 1 application topically daily as needed (for irritation).  07/10/17    [provider]  OLANZapine (ZYPREXA) 5 MG tablet Take 5 mg by mouth at bedtime. 09/29/17   [provider]  sertraline (ZOLOFT) 100 MG tablet Take 200 mg by mouth at bedtime.  02/27/14   [provider]  tolterodine (DETROL LA) 4 MG 24 hr capsule Take 4 mg by mouth at bedtime.     [provider]    Family History History reviewed. No pertinent family history.  Social History Social History   Tobacco Use  . Smoking status: Current Every Day Smoker    Packs/day: 1.50  . Smokeless tobacco: Never Used  Substance Use Topics  . Alcohol use: Yes    Alcohol/week: 0.0 standard drinks    Comment: occasionally  . Drug use: Never     Allergies   Penicillins   Review of Systems Review of Systems  Constitutional: Negative for appetite change and fatigue.  HENT: Negative for congestion, ear discharge and sinus pressure.   Eyes: Negative for discharge.  Respiratory: Positive for cough.   Cardiovascular: Negative for chest pain.  Gastrointestinal: Negative for abdominal pain and diarrhea.  Genitourinary: Negative for frequency and hematuria.  Musculoskeletal: Negative for back pain.  Skin: Negative for rash.  Neurological: Negative for seizures and headaches.  Psychiatric/Behavioral: Negative for hallucinations.     Physical Exam Updated Vital Signs BP (!) 187/92 (BP Location: Left Arm)   Pulse 70   Temp 98.1 F (  36.7 C)   Resp 18   Ht 5\' 10"  (1.778 m)   Wt 86.2 kg   SpO2 100%   BMI 27.26 kg/m   Physical Exam  Constitutional: He is oriented to person, place, and time. He appears well-developed.  HENT:  Head: Normocephalic.  Eyes: Conjunctivae and EOM are normal. No scleral icterus.  Neck: Neck supple. No thyromegaly present.  Cardiovascular: Normal rate and regular rhythm. Exam reveals no gallop and no friction rub.  No murmur heard. Pulmonary/Chest: No stridor. He has no wheezes. He has no rales. He exhibits no tenderness.    Abdominal: He exhibits no distension. There is no tenderness. There is no rebound.  Musculoskeletal: Normal range of motion. He exhibits no edema.  Lymphadenopathy:    He has no cervical adenopathy.  Neurological: He is oriented to person, place, and time. He exhibits normal muscle tone. Coordination normal.  Skin: No rash noted. No erythema.  Psychiatric: He has a normal mood and affect. His behavior is normal.     ED Treatments / Results  Labs (all labs ordered are listed, but only abnormal results are displayed) Labs Reviewed  I-STAT CHEM 8, ED - Abnormal; Notable for the following components:      Result Value   Calcium, Ion 1.14 (*)    All other components within normal limits    EKG None  Radiology Dg Chest 2 View  Result Date: 12/28/2017 CLINICAL DATA:  Cough for 3 weeks with chest pain and shortness of breath EXAM: CHEST - 2 VIEW COMPARISON:  July 16, 2017 FINDINGS: There is no evident edema or consolidation. The heart size and pulmonary vascularity are normal. No adenopathy. There is aortic atherosclerosis. There is degenerative change in the thoracic spine. IMPRESSION: No edema or consolidation.  There is aortic atherosclerosis. Aortic Atherosclerosis (ICD10-I70.0). Electronically Signed   By: Lowella Grip III M.D.   On: 12/28/2017 12:31    Procedures Procedures (including critical care time)  Medications Ordered in ED Medications - No data to display   Initial Impression / Assessment and Plan / ED Course  I have reviewed the triage vital signs and the nursing notes.  Pertinent labs & imaging results that were available during my care of the patient were reviewed by me and considered in my medical decision making (see chart for details).     Patient with persistent bronchitis.  He will be placed on doxycycline and Robitussin and follow-up with his PCP  Final Clinical Impressions(s) / ED Diagnoses   Final diagnoses:  Bronchitis    ED Discharge Orders          Ordered    doxycycline (VIBRAMYCIN) 100 MG capsule  2 times daily     12/28/17 1457    guaiFENesin-dextromethorphan (ROBITUSSIN DM) 100-10 MG/5ML syrup  Every 6 hours PRN     12/28/17 1457           Milton Ferguson, MD 12/28/17 1502

## 2017-12-28 NOTE — ED Notes (Signed)
Pt found wandering in waiting area confused as to where is he supposed to be.  Brought pt back to a bed and son called.  States pt is confused somewhat at baseline and tends to wander at times.  States he will come pick pt up when he is ready to go.

## 2017-12-28 NOTE — Telephone Encounter (Signed)
Patient wife is calling today stating pt has been having some issues with breathing, with cough and wheezing for the last 3-4 days. He is not running a temp,but he is struggling for a breath and the inhaler is not helping much. He is lethargic at times and he is taking mucinex and it is not helping. I advised the wife that the pt needed to be evaluated at the ed since he is struggling to breath and they could run xrays to wee what the problem is. She states understanding.

## 2017-12-28 NOTE — Discharge Instructions (Addendum)
Follow-up with your doctor next week for recheck 

## 2018-01-14 ENCOUNTER — Ambulatory Visit (INDEPENDENT_AMBULATORY_CARE_PROVIDER_SITE_OTHER): Payer: Commercial Managed Care - PPO | Admitting: Family Medicine

## 2018-01-14 ENCOUNTER — Encounter: Payer: Self-pay | Admitting: Family Medicine

## 2018-01-14 VITALS — BP 128/80 | Temp 98.7°F | Ht 70.0 in | Wt 187.0 lb

## 2018-01-14 DIAGNOSIS — J069 Acute upper respiratory infection, unspecified: Secondary | ICD-10-CM | POA: Diagnosis not present

## 2018-01-14 DIAGNOSIS — R300 Dysuria: Secondary | ICD-10-CM | POA: Diagnosis not present

## 2018-01-14 DIAGNOSIS — B9689 Other specified bacterial agents as the cause of diseases classified elsewhere: Secondary | ICD-10-CM

## 2018-01-14 LAB — POCT URINALYSIS DIPSTICK: pH, UA: 5 (ref 5.0–8.0)

## 2018-01-14 MED ORDER — PREDNISONE 20 MG PO TABS: ORAL_TABLET | ORAL | 0 refills | Status: DC

## 2018-01-14 MED ORDER — AZITHROMYCIN 250 MG PO TABS: ORAL_TABLET | ORAL | 0 refills | Status: DC

## 2018-01-14 NOTE — Progress Notes (Signed)
   Subjective:    Patient ID: Luis Warner, male    DOB: Mar 05, 1950, 67 y.o.   MRN: [id number]  Sinusitis  This is a new problem. Episode onset: 5 days. Associated symptoms include congestion, coughing, headaches and a sore throat. Pertinent negatives include no chills or ear pain. (Vomiting, wheezing) Past treatments include nothing.   Painful urination for 5 days.   Results for orders placed or performed in visit on 01/14/18  POCT urinalysis dipstick  Result Value Ref Range   Color, UA     Clarity, UA     Glucose, UA     Bilirubin, UA     Ketones, UA     Spec Grav, UA >=1.030 (A) 1.010 - 1.025   Blood, UA     pH, UA 5.0 5.0 - 8.0   Protein, UA     Urobilinogen, UA     Nitrite, UA     Leukocytes, UA     Appearance     Odor        Review of Systems  Constitutional: Negative for activity change, chills and fever.  HENT: Positive for congestion, rhinorrhea and sore throat. Negative for ear pain.   Eyes: Negative for discharge.  Respiratory: Positive for cough. Negative for wheezing.   Cardiovascular: Negative for chest pain.  Gastrointestinal: Negative for nausea and vomiting.  Genitourinary: Positive for dysuria. Negative for frequency.  Musculoskeletal: Negative for arthralgias.  Neurological: Positive for headaches.       Objective:   Physical Exam  Constitutional: He appears well-developed.  HENT:  Head: Normocephalic.  Mouth/Throat: Oropharynx is clear and moist. No oropharyngeal exudate.  Neck: Normal range of motion.  Cardiovascular: Normal rate, regular rhythm and normal heart sounds.  No murmur heard. Pulmonary/Chest: Effort normal and breath sounds normal. He has no wheezes.  Lymphadenopathy:    He has no cervical adenopathy.  Neurological: He exhibits normal muscle tone.  Skin: Skin is warm and dry.  Nursing note and vitals reviewed.         Assessment & Plan:  Acute rhinosinusitis Bronchial component Reactive airway component This  patient would benefit from antibiotics prednisone If having progressive troubles or worse to follow-up Warning signs were discussed in detail Call us if any problem

## 2018-02-23 DIAGNOSIS — F331 Major depressive disorder, recurrent, moderate: Secondary | ICD-10-CM | POA: Diagnosis not present

## 2018-04-16 ENCOUNTER — Other Ambulatory Visit (HOSPITAL_COMMUNITY)
Admission: RE | Admit: 2018-04-16 | Discharge: 2018-04-16 | Disposition: A | Discharge status: Home / Self Care | Payer: Commercial Managed Care - PPO | Source: Other Acute Inpatient Hospital | Attending: Urology | Admitting: Urology

## 2018-04-16 ENCOUNTER — Ambulatory Visit (INDEPENDENT_AMBULATORY_CARE_PROVIDER_SITE_OTHER): Payer: Commercial Managed Care - PPO | Admitting: Urology

## 2018-04-16 DIAGNOSIS — N3281 Overactive bladder: Secondary | ICD-10-CM | POA: Diagnosis present

## 2018-04-16 DIAGNOSIS — N401 Enlarged prostate with lower urinary tract symptoms: Secondary | ICD-10-CM

## 2018-04-16 DIAGNOSIS — R3912 Poor urinary stream: Secondary | ICD-10-CM

## 2018-04-16 LAB — URINALYSIS, COMPLETE (UACMP) WITH MICROSCOPIC
BACTERIA UA: NONE SEEN
Bilirubin Urine: NEGATIVE
GLUCOSE, UA: NEGATIVE mg/dL
Hgb urine dipstick: NEGATIVE
Ketones, ur: NEGATIVE mg/dL
Leukocytes,Ua: NEGATIVE
Nitrite: NEGATIVE
PH: 5 (ref 5.0–8.0)
PROTEIN: NEGATIVE mg/dL
Specific Gravity, Urine: 1.02 (ref 1.005–1.030)

## 2018-04-17 LAB — URINE CULTURE: CULTURE: NO GROWTH

## 2018-04-20 DIAGNOSIS — F331 Major depressive disorder, recurrent, moderate: Secondary | ICD-10-CM | POA: Diagnosis not present

## 2018-04-21 ENCOUNTER — Encounter: Payer: Self-pay | Admitting: Family Medicine

## 2018-04-21 ENCOUNTER — Ambulatory Visit (INDEPENDENT_AMBULATORY_CARE_PROVIDER_SITE_OTHER): Payer: Commercial Managed Care - PPO | Admitting: Family Medicine

## 2018-04-21 ENCOUNTER — Other Ambulatory Visit: Payer: Self-pay

## 2018-04-21 VITALS — BP 122/82 | HR 86 | Ht 70.0 in | Wt 197.6 lb

## 2018-04-21 DIAGNOSIS — Z79899 Other long term (current) drug therapy: Secondary | ICD-10-CM

## 2018-04-21 DIAGNOSIS — Z136 Encounter for screening for cardiovascular disorders: Secondary | ICD-10-CM

## 2018-04-21 DIAGNOSIS — Z0000 Encounter for general adult medical examination without abnormal findings: Secondary | ICD-10-CM

## 2018-04-21 DIAGNOSIS — R05 Cough: Secondary | ICD-10-CM | POA: Diagnosis not present

## 2018-04-21 DIAGNOSIS — R0609 Other forms of dyspnea: Secondary | ICD-10-CM | POA: Diagnosis not present

## 2018-04-21 DIAGNOSIS — Z13220 Encounter for screening for lipoid disorders: Secondary | ICD-10-CM

## 2018-04-21 DIAGNOSIS — Z125 Encounter for screening for malignant neoplasm of prostate: Secondary | ICD-10-CM | POA: Diagnosis not present

## 2018-04-21 DIAGNOSIS — R053 Chronic cough: Secondary | ICD-10-CM

## 2018-04-21 MED ORDER — ALBUTEROL SULFATE HFA 108 (90 BASE) MCG/ACT IN AERS: 2.0000 | INHALATION_SPRAY | Freq: Four times a day (QID) | RESPIRATORY_TRACT | 4 refills | Status: DC | PRN

## 2018-04-21 MED ORDER — BUDESONIDE-FORMOTEROL FUMARATE 160-4.5 MCG/ACT IN AERO: 2.0000 | INHALATION_SPRAY | Freq: Two times a day (BID) | RESPIRATORY_TRACT | 4 refills | Status: DC

## 2018-04-21 NOTE — Patient Instructions (Addendum)
We need to do several different things at this point  Chest x-ray at the hospital We will also set you up for a pulmonary function test at the hospital.  The respiratory department at the hospital will call you to set up that appointment.  I also recommend that you use a spacer with your inhaler. Symbicort should be used 2 puffs twice daily every day- we have sent in a STRONGER dose Albuterol may be used 2 puffs every 4 hours as needed.  In addition to this we will be setting you up for an ultrasound of the aorta to make sure that you are not developing an aneurysm.  We will also be considering to set you up for a CAT scan of the chest in the near future but we would like for you to do the chest x-ray first  I would like for you to do your lab work-and I also request that you follow-up here in 4 weeks  Should you have any questions please call

## 2018-04-21 NOTE — Progress Notes (Signed)
Subjective:    Patient ID: Luis Warner, male    DOB: May 16, 1950, 68 y.o.   MRN: [id number]  HPI  The patient comes in today for a wellness visit.   A review of their health history was completed.  A review of medications was also completed.  Any needed refills; yes  Eating habits: eating good  Mini cog- patient reports cognitive issues related to head injury  Falls/  MVA accidents in past few months: none  Regular exercise: every once and a while  Specialist pt sees on regular basis: urologist  Preventative health issues were discussed.   Additional concerns: SOB and cough on going for over a year- patient states he feels it is related to smoking-was smoking 2.5 packs a day but has been cutting back and got to a pack a day   Review of Systems  Constitutional: Negative for activity change, appetite change and fever.  HENT: Negative for congestion and rhinorrhea.   Eyes: Negative for discharge.  Respiratory: Negative for cough and wheezing.   Cardiovascular: Negative for chest pain.  Gastrointestinal: Negative for abdominal pain, blood in stool and vomiting.  Genitourinary: Negative for difficulty urinating and frequency.  Musculoskeletal: Negative for neck pain.  Skin: Negative for rash.  Allergic/Immunologic: Negative for environmental allergies and food allergies.  Neurological: Negative for weakness and headaches.  Psychiatric/Behavioral: Negative for agitation.       Objective:   Physical Exam Constitutional:      Appearance: He is well-developed.  HENT:     Head: Normocephalic and atraumatic.     Right Ear: External ear normal.     Left Ear: External ear normal.     Nose: Nose normal.  Eyes:     Pupils: Pupils are equal, round, and reactive to light.  Neck:     Musculoskeletal: Normal range of motion and neck supple.     Thyroid: No thyromegaly.  Cardiovascular:     Rate and Rhythm: Normal rate and regular rhythm.     Heart sounds: Normal heart  sounds. No murmur.  Pulmonary:     Effort: Pulmonary effort is normal. No respiratory distress.     Breath sounds: Normal breath sounds. No wheezing.  Abdominal:     General: Bowel sounds are normal. There is no distension.     Palpations: Abdomen is soft. There is no mass.     Tenderness: There is no abdominal tenderness.  Genitourinary:    Penis: Normal.   Musculoskeletal: Normal range of motion.  Lymphadenopathy:     Cervical: No cervical adenopathy.  Skin:    General: Skin is warm and dry.     Findings: No erythema.  Neurological:     Mental Status: He is alert.     Motor: No abnormal muscle tone.  Psychiatric:        Behavior: Behavior normal.        Judgment: Judgment normal.   Patient's cognitive issues as well as psych he had trick aspects are followed by psychiatry        Assessment & Plan:  Adult wellness-complete.wellness physical was conducted today. Importance of diet and exercise were discussed in detail.  In addition to this a discussion regarding safety was also covered. We also reviewed over immunizations and gave recommendations regarding current immunization needed for age.  In addition to this additional areas were also touched on including: Preventative health exams needed:  Colonoscopy patient is due for colonoscopy referral was given and initiated  Patient  with intermittent dyspnea when he does activity but no chest tightness pressure pain chest x-ray as well as pulmonary function test recommended patient has history of smoking could well be COPD related issues  Aorta ultrasound recommended because of history of smoking  Lab work recommended as well  Patient having chronic cough if it does not get better with the treatment that we are doing then the next step would be consideration for pulmonary consultation  Start Symbicort as directed follow-up in several months   Patient was advised yearly wellness exam

## 2018-04-30 ENCOUNTER — Other Ambulatory Visit: Payer: Self-pay

## 2018-04-30 ENCOUNTER — Ambulatory Visit (HOSPITAL_COMMUNITY)
Admission: RE | Admit: 2018-04-30 | Discharge: 2018-04-30 | Disposition: A | Discharge status: Home / Self Care | Payer: Commercial Managed Care - PPO | Source: Ambulatory Visit | Attending: Family Medicine | Admitting: Family Medicine

## 2018-04-30 DIAGNOSIS — I77811 Abdominal aortic ectasia: Secondary | ICD-10-CM | POA: Diagnosis not present

## 2018-04-30 DIAGNOSIS — Z136 Encounter for screening for cardiovascular disorders: Secondary | ICD-10-CM | POA: Diagnosis not present

## 2018-05-03 ENCOUNTER — Other Ambulatory Visit: Payer: Self-pay | Admitting: Family Medicine

## 2018-05-03 DIAGNOSIS — R9389 Abnormal findings on diagnostic imaging of other specified body structures: Secondary | ICD-10-CM

## 2018-05-07 ENCOUNTER — Encounter: Payer: Self-pay | Admitting: Family Medicine

## 2018-05-07 ENCOUNTER — Telehealth: Payer: Self-pay | Admitting: Family Medicine

## 2018-05-07 DIAGNOSIS — R9389 Abnormal findings on diagnostic imaging of other specified body structures: Secondary | ICD-10-CM

## 2018-05-07 NOTE — Telephone Encounter (Signed)
Patient's wife calling in regards of phone call with patient yesterday wife would like to have a return call to go over [place] on Aorta screening.   Wife Luis Warner) not on [place]

## 2018-05-07 NOTE — Telephone Encounter (Signed)
Please advise 

## 2018-05-10 NOTE — Telephone Encounter (Signed)
Advised per Dr Nicki Reaper : Ultrasound results are available along with my documentation in the chart We are referring him to vascular surgery because of the iliac arteries being dilated It is not emergent-Verbalized understanding

## 2018-05-10 NOTE — Telephone Encounter (Signed)
Nurses Please call the wife Ultrasound results are available along with my documentation in the chart We are referring him to vascular surgery because of the iliac arteries being dilated It is not emergent If the wife would like to do a telephone consultation we can do that Medicare will pay for that Talk with her see what she would like to do

## 2018-05-12 ENCOUNTER — Encounter (HOSPITAL_COMMUNITY): Payer: Commercial Managed Care - PPO

## 2018-05-19 ENCOUNTER — Ambulatory Visit: Payer: Commercial Managed Care - PPO | Admitting: Family Medicine

## 2018-07-26 ENCOUNTER — Telehealth: Payer: Self-pay | Admitting: Family Medicine

## 2018-07-26 NOTE — Telephone Encounter (Signed)
I would recommend that I see the patient this week If it was a office visit ideally his wife can come as well If they are not comfortable doing an office visit a virtual visit would be workable

## 2018-07-26 NOTE — Telephone Encounter (Signed)
Sorry acotts

## 2018-07-26 NOTE — Telephone Encounter (Signed)
Discussed with pt's wife and transferred to the front to schedule phone visit. She did not want to come in office.

## 2018-07-26 NOTE — Telephone Encounter (Signed)
Wife states patient has been nausea for weeks and weak. He doesn't want to do anything but sit. Wife-vicki states would rahrer have office visit instead of phone 0r virtual but review your schedule to see where we can put him in. Please advise

## 2018-07-26 NOTE — Telephone Encounter (Signed)
Having nausea for about 3 weeks. Has had some diarrhea off and on but not bad. Still eating and drinking good. No fever, maybe vomited twice. Wife not sure because he does not tell her and when she ask sometimes he can't remember. Sleeping a lot, has dementia, wife thinks some of the symptoms is anxiety because he has been in the house since march. Feeling tired and weak but this has been going on for awhile. Wife states she tried to get appt last week but could not get through on phone lines. Wife wants phone or office visit.

## 2018-07-27 ENCOUNTER — Ambulatory Visit (INDEPENDENT_AMBULATORY_CARE_PROVIDER_SITE_OTHER): Payer: Commercial Managed Care - PPO | Admitting: Family Medicine

## 2018-07-27 ENCOUNTER — Other Ambulatory Visit: Payer: Self-pay

## 2018-07-27 DIAGNOSIS — R1110 Vomiting, unspecified: Secondary | ICD-10-CM | POA: Diagnosis not present

## 2018-07-27 DIAGNOSIS — R110 Nausea: Secondary | ICD-10-CM | POA: Insufficient documentation

## 2018-07-27 DIAGNOSIS — R197 Diarrhea, unspecified: Secondary | ICD-10-CM

## 2018-07-27 MED ORDER — ONDANSETRON HCL 8 MG PO TABS: 8.0000 mg | ORAL_TABLET | Freq: Three times a day (TID) | ORAL | 1 refills | Status: DC | PRN

## 2018-07-27 NOTE — Progress Notes (Signed)
   Subjective:    Patient ID: Luis Warner, male    DOB: December 07, 1950, 68 y.o.   MRN: [id number] Phone only patient unable to do video HPI Pt has been having fatigue and nausea. Pt states has had this about 3 weeks. Pt has had vomiting and diarrhea. Pt has tried [employer]; Pepto helped a little bit.  Fatigue and nausea over the past 3 weeks not feeling good feeling rundown denies fever body aches cough runny nose relates mainly nausea Virtual Visit via Video Note  I connected with Luis Warner on 07/27/18 at  3:30 PM EDT by a video enabled telemedicine application and verified that I am speaking with the correct person using two identifiers.  Location: Patient: home Provider: office   I discussed the limitations of evaluation and management by telemedicine and the availability of in person appointments. The patient expressed understanding and agreed to proceed.  History of Present Illness:    Observations/Objective:   Assessment and Plan:   Follow Up Instructions:    I discussed the assessment and treatment plan with the patient. The patient was provided an opportunity to ask questions and all were answered. The patient agreed with the plan and demonstrated an understanding of the instructions.   The patient was advised to call back or seek an in-person evaluation if the symptoms worsen or if the condition fails to improve as anticipated.  I provided 15  minutes of non-face-to-face time during this encounter.   Vicente Males, LPN    Review of Systems  Constitutional: Negative for activity change, fatigue and fever.  HENT: Negative for congestion and rhinorrhea.   Respiratory: Negative for cough and shortness of breath.   Cardiovascular: Negative for chest pain and leg swelling.  Gastrointestinal: Positive for nausea and vomiting. Negative for abdominal pain and diarrhea.  Genitourinary: Negative for dysuria and hematuria.  Neurological: Negative for weakness and  headaches.  Psychiatric/Behavioral: Negative for agitation and behavioral problems.       Objective:   Physical Exam  Today's visit was via telephone Physical exam was not possible for this visit       Assessment & Plan:  Persistent nausea Medication sent in If not doing better over the next 24 to 48 hours may have to go to ER  Lab work recommended GI consultation initiated

## 2018-07-28 NOTE — Progress Notes (Signed)
Left message for wife to return call 

## 2018-07-29 ENCOUNTER — Telehealth: Payer: Self-pay | Admitting: Family Medicine

## 2018-07-29 NOTE — Telephone Encounter (Signed)
As per the patient's referral please be to Dr.Rehman No wife would like for him to be seen by him plus also they have seen him before in the past thank you

## 2018-07-29 NOTE — Progress Notes (Signed)
Discussed with pt's wife Jocelyn Lamer and she states she will get him up to the lab to do the labwork and will call back if she has not heard about the referral by end of next week. States he has seen rehman in the past.

## 2018-07-30 NOTE — Telephone Encounter (Signed)
Referral sent to Dr. Laural Golden, their office will contact pt to schedule

## 2018-07-31 LAB — CBC WITH DIFFERENTIAL/PLATELET
Basophils Absolute: 0.1 10*3/uL (ref 0.0–0.2)
Basos: 1 %
EOS (ABSOLUTE): 0.2 10*3/uL (ref 0.0–0.4)
Eos: 2 %
Hematocrit: 44 % (ref 37.5–51.0)
Hemoglobin: 14.6 g/dL (ref 13.0–17.7)
Immature Grans (Abs): 0 10*3/uL (ref 0.0–0.1)
Immature Granulocytes: 0 %
Lymphocytes Absolute: 2.1 10*3/uL (ref 0.7–3.1)
Lymphs: 23 %
MCH: 28.5 pg (ref 26.6–33.0)
MCHC: 33.2 g/dL (ref 31.5–35.7)
MCV: 86 fL (ref 79–97)
Monocytes Absolute: 0.7 10*3/uL (ref 0.1–0.9)
Monocytes: 7 %
Neutrophils Absolute: 6.2 10*3/uL (ref 1.4–7.0)
Neutrophils: 67 %
Platelets: 245 10*3/uL (ref 150–450)
RBC: 5.13 x10E6/uL (ref 4.14–5.80)
RDW: 14.5 % (ref 11.6–15.4)
WBC: 9.2 10*3/uL (ref 3.4–10.8)

## 2018-07-31 LAB — BASIC METABOLIC PANEL
BUN/Creatinine Ratio: 16 (ref 10–24)
BUN: 16 mg/dL (ref 8–27)
CO2: 24 mmol/L (ref 20–29)
Calcium: 9.5 mg/dL (ref 8.6–10.2)
Chloride: 104 mmol/L (ref 96–106)
Creatinine, Ser: 0.97 mg/dL (ref 0.76–1.27)
GFR calc Af Amer: 92 mL/min/{1.73_m2} (ref >59)
GFR calc non Af Amer: 79 mL/min/{1.73_m2} (ref >59)
Glucose: 96 mg/dL (ref 65–99)
Potassium: 4.5 mmol/L (ref 3.5–5.2)
Sodium: 142 mmol/L (ref 134–144)

## 2018-07-31 LAB — HEPATIC FUNCTION PANEL
ALT: 12 IU/L (ref 0–44)
AST: 17 IU/L (ref 0–40)
Albumin: 4.1 g/dL (ref 3.8–4.8)
Alkaline Phosphatase: 86 IU/L (ref 39–117)
Bilirubin Total: 0.3 mg/dL (ref 0.0–1.2)
Bilirubin, Direct: 0.09 mg/dL (ref 0.00–0.40)
Total Protein: 6.4 g/dL (ref 6.0–8.5)

## 2018-07-31 LAB — LIPASE: Lipase: 29 U/L (ref 13–78)

## 2018-08-01 ENCOUNTER — Encounter: Payer: Self-pay | Admitting: Family Medicine

## 2018-08-03 ENCOUNTER — Telehealth: Payer: Self-pay | Admitting: Family Medicine

## 2018-08-03 DIAGNOSIS — R413 Other amnesia: Secondary | ICD-10-CM

## 2018-08-03 NOTE — Telephone Encounter (Signed)
I think that would be a good idea Go ahead with referral  When why faxes they want to see please help set up

## 2018-08-03 NOTE — Telephone Encounter (Signed)
Wife would like patient referred to neurology and will fax over the name of who hey want to see

## 2018-08-03 NOTE — Telephone Encounter (Signed)
Vickie wants to talk to Abigail Butts about his bloodwork. Said she talked to her already.

## 2018-08-03 NOTE — Telephone Encounter (Signed)
Await fax.

## 2018-08-04 ENCOUNTER — Encounter: Payer: Self-pay | Admitting: Family Medicine

## 2018-08-04 NOTE — Telephone Encounter (Signed)
Referral ordered in EPIC. 

## 2018-08-12 ENCOUNTER — Other Ambulatory Visit: Payer: Self-pay

## 2018-08-12 ENCOUNTER — Ambulatory Visit (INDEPENDENT_AMBULATORY_CARE_PROVIDER_SITE_OTHER): Payer: Commercial Managed Care - PPO | Admitting: Vascular Surgery

## 2018-08-12 ENCOUNTER — Encounter: Payer: Self-pay | Admitting: Vascular Surgery

## 2018-08-12 VITALS — BP 94/61 | HR 75 | Temp 97.6°F | Resp 18 | Ht 69.0 in | Wt 175.0 lb

## 2018-08-12 DIAGNOSIS — I714 Abdominal aortic aneurysm, without rupture, unspecified: Secondary | ICD-10-CM

## 2018-08-12 NOTE — Progress Notes (Signed)
REASON FOR CONSULT:    Abnormal findings on ultrasound.  The consult is requested by Dr. Sallee Lange.   ASSESSMENT & PLAN:   ECTASIA OF THE INFRARENAL AORTA: The maximum diameter of his infrarenal aorta is 2.8 cm.  We normally consider an artery aneurysmal 1 is 1-1/2 times normal.  For the average male this would be at approximately 3 cm.  The iliac arteries also slightly enlarged.  I have recommended a follow-up ultrasound in 3 years and I have ordered that test.  I think it is very unlikely that his aorta will continue enlarged enough to consider repair.  I explained that we would normally consider elective repair of an infrarenal abdominal aortic aneurysm at 5.5 cm.  I have explained to him that the 2 main risk factors for continued enlargement of the aorta are smoking and hypertension that is poorly controlled.  I have had a long discussion with him about the importance of tobacco cessation.  This certainly does increase his risk of the aorta continuing to enlarge.  I encouraged him to stay as active as possible.  I will see him back in 3 years.  He knows to call sooner if he has problems.   Deitra Mayo, MD, FACS Beeper [phone] Office: [phone]   HPI:   Luis Warner is a pleasant 68 y.o. male, who was referred with a small abdominal aortic aneurysm.  This patient underwent screening for an abdominal aortic aneurysm.  It was noted that he had ectasia of the proximal abdominal aorta with a maximum diameter 2.8 cm.  He was therefore sent for vascular consultation.  On my history the patient denies any abdominal pain.  He does have some chronic low back pain.  This is prevented him from playing golf recently.  There is no family history of aneurysmal disease that he is aware of.  He does have a history of dementia.  He has hypertension which is been under good control.  He has a heavy smoking history.  He currently smokes 2 packs/day.  He denies any history of diabetes,  hypercholesterolemia, or family history of premature cardiovascular disease.  Past Medical History:  Diagnosis Date  . Dementia ([place])   . Hypertension     Family History  Family history unknown: Yes    SOCIAL HISTORY: Social History   Socioeconomic History  . Marital status: Married    Spouse name: Not on file  . Number of children: Not on file  . Years of education: Not on file  . Highest education level: Not on file  Occupational History  . Not on file  Social Needs  . Financial resource strain: Not on file  . Food insecurity    Worry: Not on file    Inability: Not on file  . Transportation needs    Medical: Not on file    Non-medical: Not on file  Tobacco Use  . Smoking status: Current Every Day Smoker    Packs/day: 1.50  . Smokeless tobacco: Never Used  Substance and Sexual Activity  . Alcohol use: Yes    Alcohol/week: 0.0 standard drinks    Comment: occasionally  . Drug use: Never  . Sexual activity: Not on file  Lifestyle  . Physical activity    Days per week: Not on file    Minutes per session: Not on file  . Stress: Not on file  Relationships  . Social connections    Talks on phone: Not on file  Gets together: Not on file    Attends religious service: Not on file    Active member of club or organization: Not on file    Attends meetings of clubs or organizations: Not on file    Relationship status: Not on file  . Intimate partner violence    Fear of current or ex partner: Not on file    Emotionally abused: Not on file    Physically abused: Not on file    Forced sexual activity: Not on file  Other Topics Concern  . Not on file  Social History Narrative  . Not on file    Allergies  Allergen Reactions  . Penicillins Swelling    Childhood allergy, swelling    Current Outpatient Medications  Medication Sig Dispense Refill  . clonazePAM (KLONOPIN) 1 MG tablet Take 1 mg by mouth at bedtime.   1  . OLANZapine (ZYPREXA) 5 MG tablet Take 5 mg  by mouth at bedtime.  2  . sertraline (ZOLOFT) 100 MG tablet Take 200 mg by mouth at bedtime.   0  . silodosin (RAPAFLO) 8 MG CAPS capsule Take 8 mg by mouth daily.    Marland Kitchen tolterodine (DETROL LA) 4 MG 24 hr capsule Take 4 mg by mouth at bedtime.     Marland Kitchen albuterol (PROVENTIL HFA;VENTOLIN HFA) 108 (90 Base) MCG/ACT inhaler Inhale 2 puffs into the lungs every 6 (six) hours as needed for wheezing or shortness of breath. (Patient not taking: Reported on 08/12/2018) 1 Inhaler 4  . budesonide-formoterol (SYMBICORT) 160-4.5 MCG/ACT inhaler Inhale 2 puffs into the lungs 2 (two) times daily. (Patient not taking: Reported on 08/12/2018) 1 Inhaler 4  . halobetasol (ULTRAVATE) 0.05 % ointment Apply 1 application topically daily as needed (for irritation).   2  . ondansetron (ZOFRAN) 8 MG tablet Take 1 tablet (8 mg total) by mouth every 8 (eight) hours as needed for nausea. (Patient not taking: Reported on 08/12/2018) 12 tablet 1   No current facility-administered medications for this visit.     REVIEW OF SYSTEMS:  [X]  denotes positive finding, [ ]  denotes negative finding Cardiac  Comments:  Chest pain or chest pressure:    Shortness of breath upon exertion: x   Short of breath when lying flat:    Irregular heart rhythm:        Vascular    Pain in calf, thigh, or hip brought on by ambulation:    Pain in feet at night that wakes you up from your sleep:     Blood clot in your veins:    Leg swelling:         Pulmonary    Oxygen at home:    Productive cough:     Wheezing:         Neurologic    Sudden weakness in arms or legs:     Sudden numbness in arms or legs:     Sudden onset of difficulty speaking or slurred speech:    Temporary loss of vision in one eye:     Problems with dizziness:  x       Gastrointestinal    Blood in stool:     Vomited blood:         Genitourinary    Burning when urinating:     Blood in urine:        Psychiatric    Major depression:         Hematologic    Bleeding  problems:  Problems with blood clotting too easily:        Skin    Rashes or ulcers:        Constitutional    Fever or chills:     PHYSICAL EXAM:   Vitals:   08/12/18 1437  BP: 94/61  Pulse: 75  Resp: 18  Temp: 97.6 F (36.4 C)  TempSrc: Temporal  SpO2: 98%  Weight: 175 lb (79.4 kg)  Height: 5\' 9"  (1.753 m)   GENERAL: The patient is a well-nourished male, in no acute distress. The vital signs are documented above. CARDIAC: There is a regular rate and rhythm.  VASCULAR: I do not detect carotid bruits. He has palpable femoral, popliteal, and pedal pulses bilaterally. He has no significant lower extremity swelling. PULMONARY: There is good air exchange bilaterally without wheezing or rales. ABDOMEN: Soft and non-tender with normal pitched bowel sounds.  I cannot palpate his infrarenal aorta. MUSCULOSKELETAL: There are no major deformities or cyanosis. NEUROLOGIC: No focal weakness or paresthesias are detected. SKIN: There are no ulcers or rashes noted. PSYCHIATRIC: The patient has a normal affect.  DATA:    ABDOMINAL ULTRASOUND: I reviewed the abdominal ultrasound that was done on 04/30/2018.  This shows mild proximal abdominal aortic ectasia.  The maximum diameter is 2.8 cm.  The left common iliac artery measures 1.5 cm in maximum diameter.  The right common iliac artery measures 1.2 cm in maximum diameter.  LABS: I have reviewed his labs from 07/30/2018.  GFR was 79.  Creatinine 0.97.  Platelets 245,000.  CT ABDOMEN PELVIS: I reviewed the CT abdomen pelvis from 2011.  This showed aortic atherosclerosis but no aneurysmal disease.

## 2018-08-20 ENCOUNTER — Ambulatory Visit (INDEPENDENT_AMBULATORY_CARE_PROVIDER_SITE_OTHER): Payer: Commercial Managed Care - PPO | Admitting: Urology

## 2018-08-20 ENCOUNTER — Other Ambulatory Visit: Payer: Self-pay

## 2018-08-20 DIAGNOSIS — R3912 Poor urinary stream: Secondary | ICD-10-CM | POA: Diagnosis not present

## 2018-08-20 DIAGNOSIS — N401 Enlarged prostate with lower urinary tract symptoms: Secondary | ICD-10-CM

## 2018-08-20 DIAGNOSIS — N3281 Overactive bladder: Secondary | ICD-10-CM | POA: Diagnosis not present

## 2018-11-04 ENCOUNTER — Telehealth: Payer: Self-pay | Admitting: Family Medicine

## 2018-11-04 ENCOUNTER — Other Ambulatory Visit: Payer: Self-pay | Admitting: Family Medicine

## 2018-11-04 MED ORDER — ALPRAZOLAM 0.25 MG PO TABS: 0.2500 mg | ORAL_TABLET | Freq: Two times a day (BID) | ORAL | 0 refills | Status: DC | PRN

## 2018-11-04 NOTE — Telephone Encounter (Signed)
Pt's wife called to state that pt's not taken his psych meds for 3 days  Certriline 100mg  2 @ night Olanzaprine 10mg  1 @ night Klonzapam 1mg  1 @ night   He has the meds he just forgot to take them.  Pt is having very increased anxiety.  Wife wonders of there's something we can order for him to help calm him down until she can get in touch with the psych provider.    Wife states she had tried to call them multiple times today & keeps getting a busy signal  Please advise & call pt     [facility]  (There is NOT a DPR on file for Vickie, explained to her that we would need to speak with Luis Warner when we call back & next time they are int the office he will need to sign a DPR-she verbalized understanding)

## 2018-11-04 NOTE — Telephone Encounter (Signed)
I sent in a prescription for Xanax 0.251 twice daily #20 into the pharmacy

## 2018-11-04 NOTE — Telephone Encounter (Signed)
Medication pended. Left message to return call 

## 2018-11-04 NOTE — Telephone Encounter (Signed)
He needs to reinitiate his medicines  May use Xanax 0.25 mg 1 twice daily during the day as needed for anxiety, #30 This is for temporary use until his other medications resumed their control She needs to continue to call the psychiatry office Many offices are doing increased amount of telemedicine and therefore their phone lines get tied up She may end up having to go by the psychiatry office

## 2018-11-04 NOTE — Telephone Encounter (Signed)
Please advise. Thank you

## 2018-11-05 NOTE — Telephone Encounter (Signed)
[facility]; no DPR to speak to wife

## 2018-11-09 ENCOUNTER — Other Ambulatory Visit: Payer: Self-pay

## 2018-11-09 ENCOUNTER — Ambulatory Visit (INDEPENDENT_AMBULATORY_CARE_PROVIDER_SITE_OTHER): Payer: Commercial Managed Care - PPO | Admitting: Family Medicine

## 2018-11-09 ENCOUNTER — Encounter: Payer: Self-pay | Admitting: Family Medicine

## 2018-11-09 ENCOUNTER — Encounter (INDEPENDENT_AMBULATORY_CARE_PROVIDER_SITE_OTHER): Payer: Self-pay | Admitting: *Deleted

## 2018-11-09 VITALS — BP 148/86 | Temp 97.0°F | Wt 170.4 lb

## 2018-11-09 DIAGNOSIS — K921 Melena: Secondary | ICD-10-CM

## 2018-11-09 LAB — POC HEMOCCULT BLD/STL (HOME/3-CARD/SCREEN): Fecal Occult Blood, POC: NEGATIVE

## 2018-11-09 LAB — POCT HEMOGLOBIN: Hemoglobin: 13.8 g/dL (ref 11–14.6)

## 2018-11-09 NOTE — Telephone Encounter (Signed)
Pt never called back last week.  Do I need to call pt about this message or was this addressed at visit today?

## 2018-11-09 NOTE — Progress Notes (Signed)
Subjective:    Patient ID: Luis Warner, male    DOB: 1950/04/25, 68 y.o.   MRN: [id number]  HPI Pt here today due to large amount of blood in stool. Pt states the blood was bright red and covered the  Bowl. Pt has had some diarrhea. Pt states he had an episode like this about 3 months ago.  This patient had a large amount of blood in his stool earlier today denied any constipation denied any vomiting denies abdominal pain he states occasionally uses ibuprofen he does have some dementia issues which make it difficult for him to give a full accurate history he states he had this problem 3 months ago he states he tried to get in with gastroenterology but for 1 reason or another was unable to do so patient states he is not had any problem until recently.  Has not had any recent setbacks. Results for orders placed or performed in visit on 11/09/18  POCT hemoglobin  Result Value Ref Range   Hemoglobin 13.8 11 - 14.6 g/dL   Review of Systems  Constitutional: Negative for diaphoresis and fatigue.  HENT: Negative for congestion and rhinorrhea.   Respiratory: Negative for cough and shortness of breath.   Cardiovascular: Negative for chest pain and leg swelling.  Gastrointestinal: Positive for blood in stool. Negative for abdominal pain, anal bleeding, constipation and diarrhea.  Skin: Negative for color change and rash.  Neurological: Negative for dizziness and headaches.  Psychiatric/Behavioral: Negative for behavioral problems and confusion.       Objective:   Physical Exam Vitals signs reviewed.  Constitutional:      General: He is not in acute distress. HENT:     Head: Normocephalic and atraumatic.  Eyes:     General:        Right eye: No discharge.        Left eye: No discharge.  Neck:     Trachea: No tracheal deviation.  Cardiovascular:     Rate and Rhythm: Normal rate and regular rhythm.     Heart sounds: Normal heart sounds. No murmur.  Pulmonary:     Effort: Pulmonary  effort is normal. No respiratory distress.     Breath sounds: Normal breath sounds.  Lymphadenopathy:     Cervical: No cervical adenopathy.  Skin:    General: Skin is warm and dry.  Neurological:     Mental Status: He is alert.     Coordination: Coordination normal.  Psychiatric:        Behavior: Behavior normal.    Abdomen is soft Rectal exam shows fresh dried blood but no blood in the rectal vault the small amount of stool that was present was heme-negative   25 minutes was spent with the patient.  This statement verifies that 25 minutes was indeed spent with the patient.  More than 50% of this visit-total duration of the visit-was spent in counseling and coordination of care. The issues that the patient came in for today as reflected in the diagnosis (s) please refer to documentation for further details.     Assessment & Plan:  Rectal bleeding This patient will need GI consultation and evaluation Hemoglobin good vital signs good I believe the patient is stable He does not want to go to the ER I did warn him that if he starts having significant rectal bleeding or bloody bowel movements to go to the ER right away  We will go ahead and consult with Dr. Dereck Leep  I  did speak with Dr.Rheman he states that they will reach out to the patient within the next 1 to 2 days to help set him up for office visit and colonoscopy.  I also spoke with his wife Jocelyn Lamer who has power of attorney and also does a good job reminding him she states she is aware of the consultation and she will be listening out for the phone call she states if any problems or issues she will let us know

## 2018-11-10 ENCOUNTER — Encounter (INDEPENDENT_AMBULATORY_CARE_PROVIDER_SITE_OTHER): Payer: Self-pay | Admitting: Nurse Practitioner

## 2018-11-10 ENCOUNTER — Ambulatory Visit (INDEPENDENT_AMBULATORY_CARE_PROVIDER_SITE_OTHER): Payer: Commercial Managed Care - PPO | Admitting: Nurse Practitioner

## 2018-11-10 VITALS — BP 164/87 | HR 67 | Temp 98.5°F | Ht 70.0 in | Wt 170.5 lb

## 2018-11-10 DIAGNOSIS — K625 Hemorrhage of anus and rectum: Secondary | ICD-10-CM

## 2018-11-10 NOTE — Progress Notes (Addendum)
Subjective:    Patient ID: Luis Warner, male    DOB: 19-Dec-1950, 68 y.o.   MRN: [id number]  HPI [name] is a 68 year old male with a past medical history significant for hypertension and dementia. He presents today for further evaluation regarding rectal bleeding.  He presents by himself.  He is a challenging historian due to his dementia.  I obtained his history from his chart and from his recent office visit with his primary physician Luis Warner. He reported passing a moderate amount of red blood from the rectum yesterday when passing a normal bowel movement without straining.  He denied having any associated rectal or abdominal pain. He was seen by Dr. Wolfgang Warner yesterday and a rectal exam showed dried blood outside of the rectum, the rectal exam was otherwise unremarkable.  Hemoglobin level 13.8 was reported as stable.  Dr. Wolfgang Warner contacted Dr. Dorien Warner and a colonoscopy was recommended as soon as possible. The patient presents today to schedule a colonoscopy.  I called the patient's wife, Luis Warner, she is his power of attorney. She was not at home when her husband passed the moderate amount of red blood from the rectum yesterday.  However, she stated he had an episode of blood from the rectum approximately 3 months ago as well.  She stated he underwent a colonoscopy by Dr. Dorien Warner approximately 11 years ago.  No known family history of colorectal cancer.  Luis Warner is in agreement with  Dr. Wolfgang Warner and Dr. Laural Warner, she wishes for her husband to proceed with a colonoscopy.  I discussed the benefits and risks of the colonoscopy cleaning the risk with sedation, risk of bleeding, perforation and infection.  She reassured me that she will be home the day of preparation to ensure he follows the clear liquid diet and bowel prep instructions appropriately.  She verify the patient does not have any pulmonary or cardiac disease.  Of note, the patient drove himself to the office today.  He  stated he lives approximately 4 blocks away.  I have significant concerns regarding his dementia, his impaired cognition in the setting of driving.  I spoke to Mrs. Luis Warner my concern and recommended heat the patient should not be driving.  Further discussion regarding driving would be most appropriately managed by Dr. Wolfgang Warner.  Current Outpatient Medications on File Prior to Visit  Medication Sig Dispense Refill  . albuterol (PROVENTIL HFA;VENTOLIN HFA) 108 (90 Base) MCG/ACT inhaler Inhale 2 puffs into the lungs every 6 (six) hours as needed for wheezing or shortness of breath. 1 Inhaler 4  . ALPRAZolam (XANAX) 0.25 MG tablet Take 1 tablet (0.25 mg total) by mouth 2 (two) times daily as needed for anxiety. 20 tablet 0  . budesonide-formoterol (SYMBICORT) 160-4.5 MCG/ACT inhaler Inhale 2 puffs into the lungs 2 (two) times daily. 1 Inhaler 4  . clonazePAM (KLONOPIN) 1 MG tablet Take 1 mg by mouth at bedtime.   1  . halobetasol (ULTRAVATE) 0.05 % ointment Apply 1 application topically daily as needed (for irritation).   2  . OLANZapine (ZYPREXA) 5 MG tablet Take 5 mg by mouth at bedtime.  2  . ondansetron (ZOFRAN) 8 MG tablet Take 1 tablet (8 mg total) by mouth every 8 (eight) hours as needed for nausea. 12 tablet 1  . sertraline (ZOLOFT) 100 MG tablet Take 200 mg by mouth at bedtime.   0  . silodosin (RAPAFLO) 8 MG CAPS capsule Take 8 mg by mouth daily.    Marland Kitchen  tolterodine (DETROL LA) 4 MG 24 hr capsule Take 4 mg by mouth at bedtime.      No current facility-administered medications on file prior to visit.    Allergies  Allergen Reactions  . Penicillins Swelling    Childhood allergy, swelling   Review of Systems complete review of systems secondary to patient with dementia    Objective:   Physical Exam  BP (!) 164/87   Pulse 67   Temp 98.5 F (36.9 C) (Oral)   Ht 5\' 10"  (1.778 m)   Wt 170 lb 8 oz (77.3 kg)   BMI 24.46 kg/m  General: 68 year old male hard of hearing with dementia in  no acute distress Eyes: Sclera nonicteric, conjunctiva pink Mouth: Upper dentures, no ulcers or lesions Neck: Supple, no lymphadenopathy or thyromegaly Heart: Rhythm mostly regular with occasional skipped beat, no murmurs Lungs: Breath sounds clear throughout Abdomen: Soft, nontender, no masses or organomegaly Rectal: Deferred as PCP completed rectal exam on 11/09/2018 Extremities: No edema Neuro: Patient is alert to person and place, he has difficulty answering questions, his answers reflect confusion, his speech is clear and he moves all extremities equally    Assessment & Plan:   70.  68 year old male with hematochezia.  Hemoglobin 13.8 on 11/09/2018 -Colonoscopy to be scheduled, colonoscopy benefits and risk discussed with his wife, see HPI -Patient to present to the local emergency room if he has worsening rectal bleeding -Further follow-up to be determined after colonoscopy completed  2.  Hypertension  3.   Dementia

## 2018-11-10 NOTE — Progress Notes (Signed)
Thank you-certainly appreciate it

## 2018-11-10 NOTE — Patient Instructions (Signed)
1. Our office staff will call you and your wife  tomorrow to schedule a colonoscopy  2. Call our office if you develop any further blood from the rectum 3. Go to the local emergency room if you have severe rectal bleeding

## 2018-11-10 NOTE — Progress Notes (Signed)
Ann I personally spoke with Dr.Rehman on 929.  He let me know that your office would be able to get him in relatively soon for an office visit and colonoscopy.  November seems way in the distance.  Please talk with Dr.Rehman and see if we can do better?  I am concerned this patient could have some issues that could injure him back up in the hospital.  Thank you for your follow-up for it. After talking with Dr.Rehman-I did talk with Mr. Lancer wife Vella Redhead is the [place]- Mr. Kivett has mild dementia.  I recommend when you call him either today or tomorrow for letting him know when appointment is-please try to call Jocelyn Lamer on her cell phone thank you Scott L

## 2018-11-11 ENCOUNTER — Other Ambulatory Visit (INDEPENDENT_AMBULATORY_CARE_PROVIDER_SITE_OTHER): Payer: Self-pay | Admitting: *Deleted

## 2018-11-11 ENCOUNTER — Encounter (INDEPENDENT_AMBULATORY_CARE_PROVIDER_SITE_OTHER): Payer: Self-pay | Admitting: *Deleted

## 2018-11-11 DIAGNOSIS — K625 Hemorrhage of anus and rectum: Secondary | ICD-10-CM | POA: Insufficient documentation

## 2018-11-11 NOTE — Telephone Encounter (Signed)
Situation has been handled and resolved

## 2018-11-12 ENCOUNTER — Other Ambulatory Visit: Payer: Self-pay

## 2018-11-12 ENCOUNTER — Other Ambulatory Visit (HOSPITAL_COMMUNITY)
Admission: RE | Admit: 2018-11-12 | Discharge: 2018-11-12 | Disposition: A | Discharge status: Home / Self Care | Payer: Commercial Managed Care - PPO | Source: Ambulatory Visit | Attending: Internal Medicine | Admitting: Internal Medicine

## 2018-11-12 DIAGNOSIS — Z20828 Contact with and (suspected) exposure to other viral communicable diseases: Secondary | ICD-10-CM | POA: Diagnosis not present

## 2018-11-12 DIAGNOSIS — Z01812 Encounter for preprocedural laboratory examination: Secondary | ICD-10-CM | POA: Diagnosis not present

## 2018-11-12 LAB — SARS CORONAVIRUS 2 (TAT 6-24 HRS): SARS Coronavirus 2: NEGATIVE

## 2018-11-15 ENCOUNTER — Encounter (HOSPITAL_COMMUNITY)
Admission: RE | Disposition: A | Discharge status: Home / Self Care | Payer: Self-pay | Source: Home / Self Care | Attending: Internal Medicine

## 2018-11-15 ENCOUNTER — Ambulatory Visit (HOSPITAL_COMMUNITY)
Admission: RE | Admit: 2018-11-15 | Discharge: 2018-11-15 | Disposition: A | Discharge status: Home / Self Care | Payer: Commercial Managed Care - PPO | Attending: Internal Medicine | Admitting: Internal Medicine

## 2018-11-15 ENCOUNTER — Other Ambulatory Visit: Payer: Self-pay

## 2018-11-15 ENCOUNTER — Encounter (HOSPITAL_COMMUNITY): Payer: Self-pay | Admitting: *Deleted

## 2018-11-15 DIAGNOSIS — K648 Other hemorrhoids: Secondary | ICD-10-CM | POA: Insufficient documentation

## 2018-11-15 DIAGNOSIS — I10 Essential (primary) hypertension: Secondary | ICD-10-CM | POA: Insufficient documentation

## 2018-11-15 DIAGNOSIS — K5730 Diverticulosis of large intestine without perforation or abscess without bleeding: Secondary | ICD-10-CM | POA: Insufficient documentation

## 2018-11-15 DIAGNOSIS — F17210 Nicotine dependence, cigarettes, uncomplicated: Secondary | ICD-10-CM | POA: Insufficient documentation

## 2018-11-15 DIAGNOSIS — Z86010 Personal history of colonic polyps: Secondary | ICD-10-CM | POA: Insufficient documentation

## 2018-11-15 DIAGNOSIS — Z79899 Other long term (current) drug therapy: Secondary | ICD-10-CM | POA: Insufficient documentation

## 2018-11-15 DIAGNOSIS — K625 Hemorrhage of anus and rectum: Secondary | ICD-10-CM | POA: Diagnosis not present

## 2018-11-15 DIAGNOSIS — D123 Benign neoplasm of transverse colon: Secondary | ICD-10-CM | POA: Diagnosis not present

## 2018-11-15 DIAGNOSIS — F0390 Unspecified dementia without behavioral disturbance: Secondary | ICD-10-CM | POA: Insufficient documentation

## 2018-11-15 HISTORY — PX: POLYPECTOMY: SHX5525

## 2018-11-15 HISTORY — PX: COLONOSCOPY: SHX5424

## 2018-11-15 SURGERY — COLONOSCOPY
Anesthesia: Moderate Sedation

## 2018-11-15 MED ORDER — MEPERIDINE HCL 50 MG/ML IJ SOLN
INTRAMUSCULAR | Status: AC
  Filled 2018-11-15: qty 1

## 2018-11-15 MED ORDER — SODIUM CHLORIDE 0.9 % IV SOLN
INTRAVENOUS | Status: DC
  Administered 2018-11-15: 14:00:00 via INTRAVENOUS

## 2018-11-15 MED ORDER — BENEFIBER DRINK MIX PO PACK: 4.0000 g | PACK | Freq: Every day | ORAL | Status: DC

## 2018-11-15 MED ORDER — MIDAZOLAM HCL 5 MG/5ML IJ SOLN
INTRAMUSCULAR | Status: AC
  Filled 2018-11-15: qty 10

## 2018-11-15 MED ORDER — MEPERIDINE HCL 50 MG/ML IJ SOLN
INTRAMUSCULAR | Status: DC | PRN
  Administered 2018-11-15 (×2): 25 mg via INTRAVENOUS

## 2018-11-15 MED ORDER — MIDAZOLAM HCL 5 MG/5ML IJ SOLN
INTRAMUSCULAR | Status: DC | PRN
  Administered 2018-11-15 (×2): 2 mg via INTRAVENOUS
  Administered 2018-11-15: 1 mg via INTRAVENOUS

## 2018-11-15 MED ORDER — STERILE WATER FOR IRRIGATION IR SOLN
Status: DC | PRN
  Administered 2018-11-15: 15:00:00 1.5 mL

## 2018-11-15 NOTE — H&P (Signed)
Luis Warner is an 68 y.o. male.   Chief Complaint: Patient is here for colonoscopy. HPI: Patient is 68 year old Caucasian male who experienced large-volume painless rectal bleeding last week.  He was seen by Dr. Nicki Reaper looking.  His hemoglobin was 13.8.  Patient was hemodynamically stable and he declined to be hospitalized.  Patient was seen in our office last week and now scheduled to undergo diagnostic colonoscopy.  He has not had any more bloody bowel movements.  He had another episode of rectal bleeding about 3 months ago.  He denies abdominal pain or anorexia.  Patient states he is trying to quit cigarette smoking and is down to 6 cigarettes/day. He may take Advil every now and then but not daily. Family history is negative for CRC.  Past Medical History:  Diagnosis Date  . Dementia ([place])   . Hypertension        History of colonic adenomas.  Last colonoscopy was in December 2009.      Colonic diverticulosis.  Past Surgical History:  Procedure Laterality Date  . skin grafts    . WRIST SURGERY      Family History  Family history unknown: Yes   Social History:  reports that he has been smoking. He has been smoking about 1.50 packs per day. He has never used smokeless tobacco. He reports current alcohol use. He reports that he does not use drugs.  Allergies:  Allergies  Allergen Reactions  . Penicillins Swelling    Childhood allergy, swelling Did it involve swelling of the face/tongue/throat, SOB, or low BP? Unknown Did it involve sudden or severe rash/hives, skin peeling, or any reaction on the inside of your mouth or nose? Unknown Did you need to seek medical attention at a hospital or doctor's office? Unknown When did it last happen?childhood If all above answers are "NO", may proceed with cephalosporin use.     Medications Prior to Admission  Medication Sig Dispense Refill  . albuterol (PROVENTIL HFA;VENTOLIN HFA) 108 (90 Base) MCG/ACT inhaler Inhale 2 puffs into  the lungs every 6 (six) hours as needed for wheezing or shortness of breath. 1 Inhaler 4  . ALPRAZolam (XANAX) 0.25 MG tablet Take 1 tablet (0.25 mg total) by mouth 2 (two) times daily as needed for anxiety. 20 tablet 0  . clonazePAM (KLONOPIN) 1 MG tablet Take 1 mg by mouth at bedtime.   1  . halobetasol (ULTRAVATE) 0.05 % ointment Apply 1 application topically daily as needed (for irritation).   2  . OLANZapine (ZYPREXA) 10 MG tablet Take 10 mg by mouth at bedtime.   2  . sertraline (ZOLOFT) 100 MG tablet Take 200 mg by mouth at bedtime.   0  . silodosin (RAPAFLO) 8 MG CAPS capsule Take 8 mg by mouth daily.    Marland Kitchen tolterodine (DETROL LA) 4 MG 24 hr capsule Take 4 mg by mouth at bedtime.     . ondansetron (ZOFRAN) 8 MG tablet Take 1 tablet (8 mg total) by mouth every 8 (eight) hours as needed for nausea. 12 tablet 1    No results found for this or any previous visit (from the past 48 hour(s)). No results found.  ROS  Blood pressure (!) 143/80, pulse 70, temperature 98.4 F (36.9 C), temperature source Oral, resp. rate 19, SpO2 100 %. Physical Exam  Constitutional: He appears well-developed and well-nourished.  HENT:  Mouth/Throat: Oropharynx is clear and moist.  Eyes: Conjunctivae are normal. No scleral icterus.  Neck: No thyromegaly present.  Cardiovascular: Normal rate and regular rhythm.  No murmur heard. Respiratory: Effort normal and breath sounds normal.  GI: He exhibits no distension. There is no abdominal tenderness.  Abdomen is symmetrical and soft.  He has mild midepigastric tenderness.  No organomegaly or masses.  Musculoskeletal:        General: No edema.  Lymphadenopathy:    He has no cervical adenopathy.  Neurological: He is alert.  He responds appropriately to questions.  Skin: Skin is warm and dry.     Assessment/Plan Rectal bleeding. History of colonic adenomas. Agnostic colonoscopy.  Hildred Laser, MD 11/15/2018, 3:14 PM

## 2018-11-15 NOTE — Op Note (Signed)
[facility] Patient Name: Luis Warner Procedure Date: 11/15/2018 3:23 PM MRN: [id number] Date of Birth: 1951-01-23 Attending MD: Hildred Laser , MD CSN: [id number] Age: 68 Admit Type: Outpatient Procedure:                Colonoscopy Indications:              Rectal bleeding Providers:                Hildred Laser, MD, Jeanann Lewandowsky. Sharon Seller, RN, Raphael Gibney, Technician Referring MD:             Elayne Snare. Wolfgang Phoenix, MD Medicines:                Meperidine 50 mg IV, Midazolam 5 mg IV Complications:            No immediate complications. Estimated Blood Loss:     Estimated blood loss: none. Estimated blood loss                            was minimal. Procedure:                Pre-Anesthesia Assessment:                           - Prior to the procedure, a History and Physical                            was performed, and patient medications and                            allergies were reviewed. The patient's tolerance of                            previous anesthesia was also reviewed. The risks                            and benefits of the procedure and the sedation                            options and risks were discussed with the patient.                            All questions were answered, and informed consent                            was obtained. Prior Anticoagulants: The patient has                            taken no previous anticoagulant or antiplatelet                            agents except for NSAID medication. ASA Grade  Assessment: II - A patient with mild systemic                            disease. After reviewing the risks and benefits,                            the patient was deemed in satisfactory condition to                            undergo the procedure.                           After obtaining informed consent, the colonoscope                            was passed under direct vision. Throughout  the                            procedure, the patient's blood pressure, pulse, and                            oxygen saturations were monitored continuously. The                            [id number] scope was introduced through                            the anus and advanced to the the cecum, identified                            by appendiceal orifice and ileocecal valve. The                            colonoscopy was performed without difficulty. The                            patient tolerated the procedure well. The quality                            of the bowel preparation was adequate to identify                            polyps 6 mm and larger in size. The ileocecal                            valve, appendiceal orifice, and rectum were                            photographed. Scope In: 3:24:33 PM Scope Out: 3:50:21 PM Scope Withdrawal Time: 0 hours 13 minutes 58 seconds  Total Procedure Duration: 0 hours 25 minutes 48 seconds  Findings:      The perianal and digital rectal examinations were normal.      Scattered diverticula were found in the sigmoid colon and ascending  colon.      A 5 mm polyp was found in the mid transverse colon. The polyp was       sessile. The polyp was removed with a cold snare. Resection and       retrieval were complete.      Internal hemorrhoids were found during retroflexion. The hemorrhoids       were medium-sized. Impression:               - Diverticulosis in the sigmoid colon and in the                            ascending colon.                           - One 5 mm polyp in the mid transverse colon,                            removed with a cold snare. Resected and retrieved.                           - Internal hemorrhoids. Moderate Sedation:      Moderate (conscious) sedation was administered by the endoscopy nurse       and supervised by the endoscopist. The following parameters were       monitored: oxygen saturation,  heart rate, blood pressure, CO2       capnography and response to care. Total physician intraservice time was       30 minutes. Recommendation:           - Patient has a contact number available for                            emergencies. The signs and symptoms of potential                            delayed complications were discussed with the                            patient. Return to normal activities tomorrow.                            Written discharge instructions were provided to the                            patient.                           - High fiber diet today.                           - Continue present medications.                           - No aspirin, ibuprofen, naproxen, or other                            non-steroidal anti-inflammatory drugs for 1 day.                           -  Await pathology results.                           - Repeat colonoscopy is recommended. The                            colonoscopy date will be determined after pathology                            results from today's exam become available for                            review. Procedure Code(s):        --- Professional ---                           [phone], Colonoscopy, flexible; with removal of                            tumor(s), polyp(s), or other lesion(s) by snare                            technique                           99153, Moderate sedation; each additional 15                            minutes intraservice time                           G0500, Moderate sedation services provided by the                            same physician or other qualified health care                            professional performing a gastrointestinal                            endoscopic service that sedation supports,                            requiring the presence of an independent trained                            observer to assist in the monitoring of the                             patient's level of consciousness and physiological                            status; initial 15 minutes of intra-service time;                            patient age 32 years or older (additional  time may                            be reported with [phone], as appropriate) Diagnosis Code(s):        --- Professional ---                           K64.8, Other hemorrhoids                           K63.5, Polyp of colon                           K62.5, Hemorrhage of anus and rectum                           K57.30, Diverticulosis of large intestine without                            perforation or abscess without bleeding CPT copyright 2019 [employer]. All rights reserved. The codes documented in this report are preliminary and upon coder review may  be revised to meet current compliance requirements. Hildred Laser, MD Hildred Laser, MD 11/15/2018 3:58:32 PM This report has been signed electronically. Number of Addenda: 0

## 2018-11-15 NOTE — Discharge Instructions (Signed)
No aspirin or NSAIDs for 24 hours. Resume usual medications as before. Benefiber or equivalent of 4 g by mouth daily at bedtime.. High-fiber diet. No driving for 24 hours. Physician will call with biopsy results.     Colonoscopy, Adult, Care After This sheet gives you information about how to care for yourself after your procedure. Your doctor may also give you more specific instructions. If you have problems or questions, call your doctor. What can I expect after the procedure? After the procedure, it is common to have:  A small amount of blood in your poop for 24 hours.  Some gas.  Mild cramping or bloating in your belly. Follow these instructions at home: General instructions  For the first 24 hours after the procedure: ? Do not drive or use machinery. ? Do not sign important documents. ? Do not drink alcohol. ? Do your daily activities more slowly than normal. ? Eat foods that are soft and easy to digest.  Take over-the-counter or prescription medicines only as told by your doctor. To help cramping and bloating:   Try walking around.  Put heat on your belly (abdomen) as told by your doctor. Use a heat source that your doctor recommends, such as a moist heat pack or a heating pad. ? Put a towel between your skin and the heat source. ? Leave the heat on for 20-30 minutes. ? Remove the heat if your skin turns bright red. This is especially important if you cannot feel pain, heat, or cold. You can get burned. Eating and drinking   Drink enough fluid to keep your pee (urine) clear or pale yellow.  Return to your normal diet as told by your doctor. Avoid heavy or fried foods that are hard to digest.  Avoid drinking alcohol for as long as told by your doctor. Contact a doctor if:  You have blood in your poop (stool) 2-3 days after the procedure. Get help right away if:  You have more than a small amount of blood in your poop.  You see large clumps of tissue (blood  clots) in your poop.  Your belly is swollen.  You feel sick to your stomach (nauseous).  You throw up (vomit).  You have a fever.  You have belly pain that gets worse, and medicine does not help your pain. Summary  After the procedure, it is common to have a small amount of blood in your poop. You may also have mild cramping and bloating in your belly.  For the first 24 hours after the procedure, do not drive or use machinery, do not sign important documents, and do not drink alcohol.  Get help right away if you have a lot of blood in your poop, feel sick to your stomach, have a fever, or have more belly pain. This information is not intended to replace advice given to you by your health care provider. Make sure you discuss any questions you have with your health care provider. Document Released: 03/01/2010 Document Revised: 11/27/2016 Document Reviewed: 10/22/2015 Elsevier Patient Education  [place].    High-Fiber Diet Fiber, also called dietary fiber, is a type of carbohydrate that is found in fruits, vegetables, whole grains, and beans. A high-fiber diet can have many health benefits. Your health care provider may recommend a high-fiber diet to help:  Prevent constipation. Fiber can make your bowel movements more regular.  Lower your cholesterol.  Relieve the following conditions: ? Swelling of veins in the anus (hemorrhoids). ?  Swelling and irritation (inflammation) of specific areas of the digestive tract (uncomplicated diverticulosis). ? A problem of the large intestine (colon) that sometimes causes pain and diarrhea (irritable bowel syndrome, IBS).  Prevent overeating as part of a weight-loss plan.  Prevent heart disease, type 2 diabetes, and certain cancers. What is my plan? The recommended daily fiber intake in grams (g) includes:  38 g for men age 69 or younger.  30 g for men over age 22.  20 g for women age 26 or younger.  21 g for women over age  32. You can get the recommended daily intake of dietary fiber by:  Eating a variety of fruits, vegetables, grains, and beans.  Taking a fiber supplement, if it is not possible to get enough fiber through your diet. What do I need to know about a high-fiber diet?  It is better to get fiber through food sources rather than from fiber supplements. There is not a lot of research about how effective supplements are.  Always check the fiber content on the nutrition facts label of any prepackaged food. Look for foods that contain 5 g of fiber or more per serving.  Talk with a diet and nutrition specialist (dietitian) if you have questions about specific foods that are recommended or not recommended for your medical condition, especially if those foods are not listed below.  Gradually increase how much fiber you consume. If you increase your intake of dietary fiber too quickly, you may have bloating, cramping, or gas.  Drink plenty of water. Water helps you to digest fiber. What are tips for following this plan?  Eat a wide variety of high-fiber foods.  Make sure that half of the grains that you eat each day are whole grains.  Eat breads and cereals that are made with whole-grain flour instead of refined flour or white flour.  Eat brown rice, bulgur wheat, or millet instead of white rice.  Start the day with a breakfast that is high in fiber, such as a cereal that contains 5 g of fiber or more per serving.  Use beans in place of meat in soups, salads, and pasta dishes.  Eat high-fiber snacks, such as berries, raw vegetables, nuts, and popcorn.  Choose whole fruits and vegetables instead of processed forms like juice or sauce. What foods can I eat?  Fruits Berries. Pears. Apples. Oranges. Avocado. Prunes and raisins. Dried figs. Vegetables Sweet potatoes. Spinach. Kale. Artichokes. Cabbage. Broccoli. Cauliflower. Green peas. Carrots. Squash. Grains Whole-grain breads. Multigrain  cereal. Oats and oatmeal. Brown rice. Barley. Bulgur wheat. [place]. Quinoa. Bran muffins. Popcorn. Rye wafer crackers. Meats and other proteins Navy, kidney, and pinto beans. Soybeans. Split peas. Lentils. Nuts and seeds. Dairy Fiber-fortified yogurt. Beverages Fiber-fortified soy milk. Fiber-fortified orange juice. Other foods Fiber bars. The items listed above may not be a complete list of recommended foods and beverages. Contact a dietitian for more options. What foods are not recommended? Fruits Fruit juice. Cooked, strained fruit. Vegetables Fried potatoes. Canned vegetables. Well-cooked vegetables. Grains White bread. Pasta made with refined flour. White rice. Meats and other proteins Fatty cuts of meat. Fried chicken or fried fish. Dairy Milk. Yogurt. Cream cheese. Sour cream. Fats and oils Butters. Beverages Soft drinks. Other foods Cakes and pastries. The items listed above may not be a complete list of foods and beverages to avoid. Contact a dietitian for more information. Summary  Fiber is a type of carbohydrate. It is found in fruits, vegetables, whole grains, and beans.  There are many health benefits of eating a high-fiber diet, such as preventing constipation, lowering blood cholesterol, helping with weight loss, and reducing your risk of heart disease, diabetes, and certain cancers.  Gradually increase your intake of fiber. Increasing too fast can result in cramping, bloating, and gas. Drink plenty of water while you increase your fiber.  The best sources of fiber include whole fruits and vegetables, whole grains, nuts, seeds, and beans. This information is not intended to replace advice given to you by your health care provider. Make sure you discuss any questions you have with your health care provider. Document Released: 01/27/2005 Document Revised: 12/01/2016 Document Reviewed: 12/01/2016 Elsevier Patient Education  2020 [employer].

## 2018-11-17 LAB — SURGICAL PATHOLOGY

## 2018-11-22 ENCOUNTER — Encounter (HOSPITAL_COMMUNITY): Payer: Self-pay | Admitting: Internal Medicine

## 2018-12-16 ENCOUNTER — Ambulatory Visit (INDEPENDENT_AMBULATORY_CARE_PROVIDER_SITE_OTHER): Payer: Commercial Managed Care - PPO | Admitting: Nurse Practitioner

## 2018-12-21 DIAGNOSIS — Z79899 Other long term (current) drug therapy: Secondary | ICD-10-CM | POA: Diagnosis not present

## 2018-12-21 DIAGNOSIS — G4752 REM sleep behavior disorder: Secondary | ICD-10-CM | POA: Diagnosis not present

## 2018-12-21 DIAGNOSIS — Z880 Allergy status to penicillin: Secondary | ICD-10-CM | POA: Diagnosis not present

## 2018-12-21 DIAGNOSIS — S069X1S Unspecified intracranial injury with loss of consciousness of 30 minutes or less, sequela: Secondary | ICD-10-CM | POA: Diagnosis not present

## 2018-12-21 DIAGNOSIS — F0150 Vascular dementia without behavioral disturbance: Secondary | ICD-10-CM | POA: Diagnosis not present

## 2018-12-21 DIAGNOSIS — R441 Visual hallucinations: Secondary | ICD-10-CM | POA: Diagnosis not present

## 2018-12-21 DIAGNOSIS — G9389 Other specified disorders of brain: Secondary | ICD-10-CM | POA: Diagnosis not present

## 2018-12-21 DIAGNOSIS — Z87820 Personal history of traumatic brain injury: Secondary | ICD-10-CM | POA: Diagnosis not present

## 2018-12-21 DIAGNOSIS — G3183 Dementia with Lewy bodies: Secondary | ICD-10-CM | POA: Diagnosis not present

## 2018-12-21 DIAGNOSIS — F064 Anxiety disorder due to known physiological condition: Secondary | ICD-10-CM | POA: Diagnosis not present

## 2018-12-21 DIAGNOSIS — Z87828 Personal history of other (healed) physical injury and trauma: Secondary | ICD-10-CM | POA: Diagnosis not present

## 2018-12-21 DIAGNOSIS — F0281 Dementia in other diseases classified elsewhere with behavioral disturbance: Secondary | ICD-10-CM | POA: Diagnosis not present

## 2019-01-03 ENCOUNTER — Ambulatory Visit
Admission: EM | Admit: 2019-01-03 | Discharge: 2019-01-03 | Disposition: A | Discharge status: Home / Self Care | Payer: Medicare Other

## 2019-01-03 DIAGNOSIS — S01311A Laceration without foreign body of right ear, initial encounter: Secondary | ICD-10-CM | POA: Diagnosis not present

## 2019-01-03 DIAGNOSIS — W19XXXA Unspecified fall, initial encounter: Secondary | ICD-10-CM | POA: Diagnosis not present

## 2019-01-03 NOTE — Discharge Instructions (Signed)
Offered further evaluation and management in the ED for head trauma following fall.  Patient and family declines at this time.  Cannot rule out brain bleed in urgent care, and missed diagnosis would be fatal.  Patient and family aware and would like to proceed with laceration repair.    3 sutures placed Bandage applied Keep covered for next and dry for next 24-48 hours.  After then you may gently clean with warm water and mild soap.  Avoid submerging wound in water. Change dressing daily and apply a thin layer of neosporin.  Return in 3-5 days to have sutures removed.   Take OTC ibuprofen or tylenol as needed for pain releif Return sooner or go to the ED if you have any new or worsening symptoms such as increased pain, redness, swelling, drainage, discharge, decreased range of motion of extremity, etc..

## 2019-01-03 NOTE — ED Provider Notes (Signed)
[place]   [id number] 01/03/19 Arrival Time: [id number]  CC: RT ear laceration  SUBJECTIVE:  Luis Warner is a 68 y.o. male who presents with RT ear laceration that occurred last night.  Symptoms began after fall from bed.  Does admit to head trauma.  Denies LOC or blacking out.  Witnessed fall by wife.  Bleeding controlled.  Currently not on blood thinners.  Reports similar falls in the past with hx of dementia.  Has CT scans done frequently.  Denies fever, chills, nausea, vomiting, redness, swelling, purulent drainage.   Td UTD: Yes.  ROS: As per HPI.  All other pertinent ROS negative.     Past Medical History:  Diagnosis Date  . Dementia ([place])   . Hypertension    Past Surgical History:  Procedure Laterality Date  . COLONOSCOPY N/A 11/15/2018   Procedure: COLONOSCOPY;  Surgeon: Rogene Houston, MD;  [facility];  Service: Endoscopy;  Laterality: N/A;  225pm  . POLYPECTOMY  11/15/2018   Procedure: POLYPECTOMY;  Surgeon: Rogene Houston, MD;  Location: [facility];  Service: Endoscopy;;  transverse colon  . skin grafts    . WRIST SURGERY     Allergies  Allergen Reactions  . Penicillins Swelling    Childhood allergy, swelling Did it involve swelling of the face/tongue/throat, SOB, or low BP? Unknown Did it involve sudden or severe rash/hives, skin peeling, or any reaction on the inside of your mouth or nose? Unknown Did you need to seek medical attention at a hospital or doctor's office? Unknown When did it last happen?childhood If all above answers are "NO", may proceed with cephalosporin use.    No current facility-administered medications on file prior to encounter.    Current Outpatient Medications on File Prior to Encounter  Medication Sig Dispense Refill  . donepezil (ARICEPT) 5 MG tablet Take 1 pill every morning after breakfast for one month then increase to 2 pills (10 mg) and continue    . albuterol (PROVENTIL HFA;VENTOLIN HFA)  108 (90 Base) MCG/ACT inhaler Inhale 2 puffs into the lungs every 6 (six) hours as needed for wheezing or shortness of breath. 1 Inhaler 4  . ALPRAZolam (XANAX) 0.25 MG tablet Take 1 tablet (0.25 mg total) by mouth 2 (two) times daily as needed for anxiety. 20 tablet 0  . clonazePAM (KLONOPIN) 1 MG tablet Take 1 mg by mouth at bedtime.   1  . halobetasol (ULTRAVATE) 0.05 % ointment Apply 1 application topically daily as needed (for irritation).   2  . OLANZapine (ZYPREXA) 10 MG tablet Take 10 mg by mouth at bedtime.   2  . ondansetron (ZOFRAN) 8 MG tablet Take 1 tablet (8 mg total) by mouth every 8 (eight) hours as needed for nausea. 12 tablet 1  . sertraline (ZOLOFT) 100 MG tablet Take 200 mg by mouth at bedtime.   0  . silodosin (RAPAFLO) 8 MG CAPS capsule Take 8 mg by mouth daily.    Marland Kitchen tolterodine (DETROL LA) 4 MG 24 hr capsule Take 4 mg by mouth at bedtime.     . Wheat Dextrin (BENEFIBER DRINK MIX) PACK Take 4 g by mouth at bedtime.     Social History   Socioeconomic History  . Marital status: Married    Spouse name: Not on file  . Number of children: Not on file  . Years of education: Not on file  . Highest education level: Not on file  Occupational History  . Not  on file  Social Needs  . Financial resource strain: Not on file  . Food insecurity    Worry: Not on file    Inability: Not on file  . Transportation needs    Medical: Not on file    Non-medical: Not on file  Tobacco Use  . Smoking status: Current Every Day Smoker    Packs/day: 1.50  . Smokeless tobacco: Never Used  Substance and Sexual Activity  . Alcohol use: Yes    Alcohol/week: 0.0 standard drinks    Comment: occasionally  . Drug use: Never  . Sexual activity: Not on file  Lifestyle  . Physical activity    Days per week: Not on file    Minutes per session: Not on file  . Stress: Not on file  Relationships  . Social Herbalist on phone: Not on file    Gets together: Not on file    Attends  religious service: Not on file    Active member of club or organization: Not on file    Attends meetings of clubs or organizations: Not on file    Relationship status: Not on file  . Intimate partner violence    Fear of current or ex partner: Not on file    Emotionally abused: Not on file    Physically abused: Not on file    Forced sexual activity: Not on file  Other Topics Concern  . Not on file  Social History Narrative  . Not on file   Family History  Family history unknown: Yes     OBJECTIVE:  Vitals:   01/03/19 1318  BP: 129/77  Pulse: 74  Resp: 18  Temp: 98.9 F (37.2 C)  TempSrc: Oral  SpO2: 98%     General appearance: alert; no distress Skin: laceration of helix of RT ear; size: approx 1 cm with dried blood Psychological: alert and cooperative; normal mood and affect  Procedure: Verbal consent obtained. Patient provided with risks and alternatives to the procedure. Wound cleaned with SAF clense AF solution.  Anesthetized with 4 mL of lidocaine without epinephrine. Wound carefully explored. No foreign body, or nonviable tissue were noted. Using sterile technique 3 interrupted 5-0 Prolene sutures were placed to reapproximate the wound. Patient tolerated procedure well. No complications. Minimal bleeding. Patient advised to look for and return for any signs of infection such as redness, swelling, discharge, or worsening pain. Return for suture removal in 7 days.  ASSESSMENT & PLAN:  1. Laceration of helix of right ear, initial encounter   2. Fall, initial encounter    Offered further evaluation and management in the ED for head trauma following fall.  Patient and family declines at this time.  Cannot rule out brain bleed in urgent care, and missed diagnosis would be fatal.  Patient and family aware and would like to proceed with laceration repair.    3 sutures placed Bandage applied Keep covered for next and dry for next 24-48 hours.  After then you may gently  clean with warm water and mild soap.  Avoid submerging wound in water. Change dressing daily and apply a thin layer of neosporin.  Return in 3-5 days to have sutures removed.   Take OTC ibuprofen or tylenol as needed for pain releif Return sooner or go to the ED if you have any new or worsening symptoms such as increased pain, redness, swelling, drainage, discharge, decreased range of motion of extremity, etc..     Reviewed expectations re:  course of current medical issues. Questions answered. Outlined signs and symptoms indicating need for more acute intervention. Patient verbalized understanding. After Visit Summary given.   Lestine Box, PA-C 01/03/19 1551

## 2019-01-03 NOTE — ED Triage Notes (Signed)
Pt had fall against the bed last night and has laceration to right ear, pts son is with patient and states pt has multiple falls. Pt also hit right side of temple

## 2019-01-10 ENCOUNTER — Other Ambulatory Visit: Payer: Self-pay

## 2019-01-10 ENCOUNTER — Ambulatory Visit
Admission: EM | Admit: 2019-01-10 | Discharge: 2019-01-10 | Disposition: A | Discharge status: Home / Self Care | Payer: Medicare Other

## 2019-01-10 DIAGNOSIS — S01311D Laceration without foreign body of right ear, subsequent encounter: Secondary | ICD-10-CM

## 2019-01-10 DIAGNOSIS — Z4802 Encounter for removal of sutures: Secondary | ICD-10-CM | POA: Diagnosis not present

## 2019-01-10 NOTE — ED Triage Notes (Signed)
Pt here for suture removal , 3 stiches removed from left ear , skin is well approximated

## 2019-01-21 ENCOUNTER — Encounter: Payer: Self-pay | Admitting: Family Medicine

## 2019-01-21 DIAGNOSIS — F0280 Dementia in other diseases classified elsewhere without behavioral disturbance: Secondary | ICD-10-CM

## 2019-01-21 DIAGNOSIS — F02818 Dementia in other diseases classified elsewhere, unspecified severity, with other behavioral disturbance: Secondary | ICD-10-CM | POA: Insufficient documentation

## 2019-01-21 DIAGNOSIS — G3183 Dementia with Lewy bodies: Secondary | ICD-10-CM

## 2019-01-21 HISTORY — DX: Dementia in other diseases classified elsewhere, unspecified severity, without behavioral disturbance, psychotic disturbance, mood disturbance, and anxiety: F02.80

## 2019-01-21 HISTORY — DX: Neurocognitive disorder with Lewy bodies: G31.83

## 2019-02-01 ENCOUNTER — Encounter: Payer: Self-pay | Admitting: Urology

## 2019-02-09 ENCOUNTER — Other Ambulatory Visit: Payer: Self-pay

## 2019-02-09 ENCOUNTER — Telehealth: Payer: Self-pay | Admitting: Urology

## 2019-02-09 DIAGNOSIS — N3281 Overactive bladder: Secondary | ICD-10-CM

## 2019-02-09 MED ORDER — MIRABEGRON ER 25 MG PO TB24: 25.0000 mg | ORAL_TABLET | Freq: Every day | ORAL | 3 refills | Status: DC

## 2019-02-09 NOTE — Telephone Encounter (Signed)
Wife notified to stop tolteradine and rx sent in for myrbetriq.

## 2019-02-09 NOTE — Telephone Encounter (Signed)
Mr. Goralczyk could try Myrbetriq and if they want to get samples if we have them he could start with 25mg  and if we don't have a way to get them samples, please send Myrbetriq 25mg  po qday #30 with refills and have him stop the tolteradine.

## 2019-03-18 DIAGNOSIS — Z23 Encounter for immunization: Secondary | ICD-10-CM | POA: Diagnosis not present

## 2019-03-25 ENCOUNTER — Other Ambulatory Visit: Payer: Self-pay

## 2019-03-25 ENCOUNTER — Ambulatory Visit (INDEPENDENT_AMBULATORY_CARE_PROVIDER_SITE_OTHER): Payer: Medicare HMO | Admitting: Urology

## 2019-03-25 ENCOUNTER — Encounter: Payer: Self-pay | Admitting: Urology

## 2019-03-25 VITALS — BP 110/65 | HR 77 | Temp 96.0°F | Ht 69.0 in | Wt 175.0 lb

## 2019-03-25 DIAGNOSIS — R3912 Poor urinary stream: Secondary | ICD-10-CM | POA: Diagnosis not present

## 2019-03-25 DIAGNOSIS — R3915 Urgency of urination: Secondary | ICD-10-CM

## 2019-03-25 DIAGNOSIS — N401 Enlarged prostate with lower urinary tract symptoms: Secondary | ICD-10-CM

## 2019-03-25 DIAGNOSIS — N138 Other obstructive and reflux uropathy: Secondary | ICD-10-CM

## 2019-03-25 DIAGNOSIS — N3281 Overactive bladder: Secondary | ICD-10-CM | POA: Diagnosis not present

## 2019-03-25 DIAGNOSIS — N5201 Erectile dysfunction due to arterial insufficiency: Secondary | ICD-10-CM

## 2019-03-25 LAB — POCT URINALYSIS DIPSTICK
Bilirubin, UA: NEGATIVE
Blood, UA: NEGATIVE
Glucose, UA: NEGATIVE
Ketones, UA: NEGATIVE
Leukocytes, UA: NEGATIVE
Nitrite, UA: NEGATIVE
Protein, UA: NEGATIVE
Spec Grav, UA: 1.015 (ref 1.010–1.025)
Urobilinogen, UA: NEGATIVE E.U./dL — AB
pH, UA: 5 (ref 5.0–8.0)

## 2019-03-25 MED ORDER — SILODOSIN 8 MG PO CAPS: 8.0000 mg | ORAL_CAPSULE | Freq: Every day | ORAL | 3 refills | Status: DC

## 2019-03-25 MED ORDER — MIRABEGRON ER 25 MG PO TB24: 25.0000 mg | ORAL_TABLET | Freq: Every day | ORAL | 0 refills | Status: DC

## 2019-03-25 MED ORDER — MIRABEGRON ER 50 MG PO TB24: 50.0000 mg | ORAL_TABLET | Freq: Every day | ORAL | 0 refills | Status: DC

## 2019-03-25 NOTE — Progress Notes (Signed)
Urological Symptom Review  Patient is experiencing the following symptoms: None Review of Systems  Gastrointestinal (upper)  : Nausea    Gastrointestinal (lower) : Negative for lower GI symptoms  Constitutional : Negative for symptoms  Skin: Negative for skin symptoms  Eyes: Negative for eye symptoms  Ear/Nose/Throat : Negative for Ear/Nose/Throat symptoms  Hematologic/Lymphatic: Negative for Hematologic/Lymphatic symptoms  Cardiovascular : Negative for cardiovascular symptoms  Respiratory : Negative for respiratory symptoms  Endocrine: Negative for endocrine symptoms  Musculoskeletal: Negative for musculoskeletal symptoms  Neurological: Negative for neurological symptoms  Psychologic: Negative for psychiatric symptoms

## 2019-03-25 NOTE — Progress Notes (Signed)
Subjective:  1. BPH with urinary obstruction   2. Overactive bladder   3. Weak urinary stream   4. Urgency of urination   5. Erectile dysfunction due to arterial insufficiency      [name] returns today in f/u for his history of BPH with BOO and OAB. He is off of tolteradine because he was diagnosed with dementia.   The Myrbetriq was too expensive.  He remains on silodosin 8mg  daily.  His IPSS is 23.  He has frequency, with a reduced stream and intermittency.  He has nocturia x 1.   He has some urgency but no significant incontinence.   He has had no dysuria or hematuria.  His UA is unremarkable today. His last PSA was 0.4 in 3/20 which was down from 0.8 in 11/19.  He has no other associated signs or symptoms.      ROS:  ROS:  A complete review of systems was performed.  All systems are negative except for pertinent findings as noted.   Review of Systems  Musculoskeletal: Positive for back pain.  Psychiatric/Behavioral: Positive for memory loss.  All other systems reviewed and are negative.   Allergies  Allergen Reactions  . Penicillins Swelling    Childhood allergy, swelling Did it involve swelling of the face/tongue/throat, SOB, or low BP? Unknown Did it involve sudden or severe rash/hives, skin peeling, or any reaction on the inside of your mouth or nose? Unknown Did you need to seek medical attention at a hospital or doctor's office? Unknown When did it last happen?childhood If all above answers are "NO", may proceed with cephalosporin use.     Outpatient Encounter Medications as of 03/25/2019  Medication Sig Note  . albuterol (PROVENTIL HFA;VENTOLIN HFA) 108 (90 Base) MCG/ACT inhaler Inhale 2 puffs into the lungs every 6 (six) hours as needed for wheezing or shortness of breath.   . ALPRAZolam (XANAX) 0.25 MG tablet Take 1 tablet (0.25 mg total) by mouth 2 (two) times daily as needed for anxiety.   . clonazePAM (KLONOPIN) 1 MG tablet Take 1 mg by mouth at  bedtime.    . mirabegron ER (MYRBETRIQ) 25 MG TB24 tablet Take 1 tablet (25 mg total) by mouth daily for 28 days.   Marland Kitchen OLANZapine (ZYPREXA) 10 MG tablet Take 10 mg by mouth at bedtime.    . sertraline (ZOLOFT) 100 MG tablet Take 200 mg by mouth at bedtime.  08/02/2014: Take two daily  . silodosin (RAPAFLO) 8 MG CAPS capsule Take 1 capsule (8 mg total) by mouth daily.   . Wheat Dextrin (BENEFIBER DRINK MIX) PACK Take 4 g by mouth at bedtime.   . [DISCONTINUED] mirabegron ER (MYRBETRIQ) 25 MG TB24 tablet Take 1 tablet (25 mg total) by mouth daily.   . [DISCONTINUED] silodosin (RAPAFLO) 8 MG CAPS capsule Take 8 mg by mouth daily.   Marland Kitchen donepezil (ARICEPT) 5 MG tablet Take 1 pill every morning after breakfast for one month then increase to 2 pills (10 mg) and continue   . halobetasol (ULTRAVATE) 0.05 % ointment Apply 1 application topically daily as needed (for irritation).    . mirabegron ER (MYRBETRIQ) 50 MG TB24 tablet Take 1 tablet (50 mg total) by mouth daily. (Patient not taking: Reported on 03/25/2019)   . ondansetron (ZOFRAN) 8 MG tablet Take 1 tablet (8 mg total) by mouth every 8 (eight) hours as needed for nausea. (Patient not taking: Reported on 03/25/2019)    No facility-administered encounter medications on file as of  03/25/2019.    Past Medical History:  Diagnosis Date  . Dementia ([place])   . Hypertension   . Lewy body dementia ([place]) 01/21/2019   Followed by specialist at [facility] Dr. Georgetta Haber    Past Surgical History:  Procedure Laterality Date  . COLONOSCOPY N/A 11/15/2018   Procedure: COLONOSCOPY;  Surgeon: Rogene Houston, MD;  [facility];  Service: Endoscopy;  Laterality: N/A;  225pm  . POLYPECTOMY  11/15/2018   Procedure: POLYPECTOMY;  Surgeon: Rogene Houston, MD;  [facility]: [facility];  Service: Endoscopy;;  transverse colon  . skin grafts    . WRIST SURGERY      Social History   Socioeconomic History  . Marital status: Married    Spouse name: Not on  file  . Number of children: Not on file  . Years of education: Not on file  . Highest education level: Not on file  Occupational History  . Not on file  Tobacco Use  . Smoking status: Current Every Day Smoker    Packs/day: 1.50  . Smokeless tobacco: Never Used  Substance and Sexual Activity  . Alcohol use: Yes    Alcohol/week: 0.0 standard drinks    Comment: occasionally  . Drug use: Never  . Sexual activity: Not on file  Other Topics Concern  . Not on file  Social History Narrative  . Not on file   Social Determinants of Health   Financial Resource Strain:   . Difficulty of Paying Living Expenses: Not on file  Food Insecurity:   . Worried About Charity fundraiser in the Last Year: Not on file  . Ran Out of Food in the Last Year: Not on file  Transportation Needs:   . Lack of Transportation (Medical): Not on file  . Lack of Transportation (Non-Medical): Not on file  Physical Activity:   . Days of Exercise per Week: Not on file  . Minutes of Exercise per Session: Not on file  Stress:   . Feeling of Stress : Not on file  Social Connections:   . Frequency of Communication with Friends and Family: Not on file  . Frequency of Social Gatherings with Friends and Family: Not on file  . Attends Religious Services: Not on file  . Active Member of Clubs or Organizations: Not on file  . Attends Archivist Meetings: Not on file  . Marital Status: Not on file  Intimate Partner Violence:   . Fear of Current or Ex-Partner: Not on file  . Emotionally Abused: Not on file  . Physically Abused: Not on file  . Sexually Abused: Not on file    Family History  Family history unknown: Yes       Objective: Vitals:   03/25/19 1038  BP: 110/65  Pulse: 77  Temp: (!) 96 F (35.6 C)     Physical Exam  Lab Results:  Results for orders placed or performed in visit on 03/25/19 (from the past 24 hour(s))  POCT urinalysis dipstick     Status: Abnormal   Collection Time:  03/25/19 11:03 AM  Result Value Ref Range   Color, UA yellow    Clarity, UA clear    Glucose, UA Negative Negative   Bilirubin, UA neg    Ketones, UA neg    Spec Grav, UA 1.015 1.010 - 1.025   Blood, UA neg    pH, UA 5.0 5.0 - 8.0   Protein, UA Negative Negative   Urobilinogen, UA  negative (A) 0.2 or 1.0 E.U./dL   Nitrite, UA neg    Leukocytes, UA Negative Negative   Appearance clear    Odor      BMET No results for input(s): NA, K, CL, CO2, GLUCOSE, BUN, CREATININE, CALCIUM in the last 72 hours. PSA No results found for: PSA No results found for: TESTOSTERONE    Studies/Results: No results found.    Assessment & Plan: 1. BPH with BOO and urgency.   I have refilled the Silodosin and have given samples of Myrbetriq 25mg  and 50mg .  He will return in 3 months and will let me know if they need a script or additional samples for the Myrbetriq.    Meds ordered this encounter  Medications  . mirabegron ER (MYRBETRIQ) 25 MG TB24 tablet    Sig: Take 1 tablet (25 mg total) by mouth daily for 28 days.    Dispense:  28 tablet    Refill:  0  . mirabegron ER (MYRBETRIQ) 50 MG TB24 tablet    Sig: Take 1 tablet (50 mg total) by mouth daily.    Dispense:  28 tablet    Refill:  0  . silodosin (RAPAFLO) 8 MG CAPS capsule    Sig: Take 1 capsule (8 mg total) by mouth daily.    Dispense:  90 capsule    Refill:  3     Orders Placed This Encounter  Procedures  . POCT urinalysis dipstick      Return in about 3 months (around 06/22/2019) for F/u to assess response to Myrbetriq. Marland Kitchen   CC: Kathyrn Drown, MD      Irine Seal 03/25/2019

## 2019-04-16 DIAGNOSIS — Z23 Encounter for immunization: Secondary | ICD-10-CM | POA: Diagnosis not present

## 2019-05-03 ENCOUNTER — Telehealth: Payer: Self-pay | Admitting: Urology

## 2019-05-03 NOTE — Telephone Encounter (Signed)
Wife called back- myrbetriq samples were given to try. Pt was given solifenacin  rx and has filled. Wife wants to know if he can keep taking that instead of myrbetriq. Myrbetriq was over $200 per wife.

## 2019-05-03 NOTE — Telephone Encounter (Signed)
If he is doing well on the solfenacin that is fine.  The side effect tend to be dry mouth, constipation and possibly some cognitive issues like memory loss.

## 2019-05-03 NOTE — Telephone Encounter (Signed)
Pts wife called and asks if there is a generic for Myrbetriq? She states they are using samples and it is working but she is concerned about the price going forward.

## 2019-05-03 NOTE — Telephone Encounter (Signed)
Wife notified.

## 2019-06-12 ENCOUNTER — Other Ambulatory Visit: Payer: Self-pay

## 2019-06-12 ENCOUNTER — Emergency Department (HOSPITAL_COMMUNITY)
Admission: EM | Admit: 2019-06-12 | Discharge: 2019-06-12 | Disposition: A | Discharge status: Home / Self Care | Payer: Medicare HMO | Attending: Emergency Medicine | Admitting: Emergency Medicine

## 2019-06-12 ENCOUNTER — Emergency Department (HOSPITAL_COMMUNITY): Payer: Medicare HMO

## 2019-06-12 ENCOUNTER — Encounter (HOSPITAL_COMMUNITY): Payer: Self-pay | Admitting: *Deleted

## 2019-06-12 ENCOUNTER — Ambulatory Visit
Admission: EM | Admit: 2019-06-12 | Discharge: 2019-06-12 | Disposition: A | Discharge status: Home / Self Care | Payer: Medicare HMO | Source: Home / Self Care

## 2019-06-12 DIAGNOSIS — Z79899 Other long term (current) drug therapy: Secondary | ICD-10-CM | POA: Insufficient documentation

## 2019-06-12 DIAGNOSIS — F17210 Nicotine dependence, cigarettes, uncomplicated: Secondary | ICD-10-CM | POA: Insufficient documentation

## 2019-06-12 DIAGNOSIS — R1031 Right lower quadrant pain: Secondary | ICD-10-CM

## 2019-06-12 DIAGNOSIS — F0390 Unspecified dementia without behavioral disturbance: Secondary | ICD-10-CM | POA: Insufficient documentation

## 2019-06-12 DIAGNOSIS — I10 Essential (primary) hypertension: Secondary | ICD-10-CM | POA: Diagnosis not present

## 2019-06-12 LAB — CBC WITH DIFFERENTIAL/PLATELET
Abs Immature Granulocytes: 0.02 10*3/uL (ref 0.00–0.07)
Basophils Absolute: 0 10*3/uL (ref 0.0–0.1)
Basophils Relative: 1 %
Eosinophils Absolute: 0.1 10*3/uL (ref 0.0–0.5)
Eosinophils Relative: 1 %
HCT: 47.5 % (ref 39.0–52.0)
Hemoglobin: 15.6 g/dL (ref 13.0–17.0)
Immature Granulocytes: 0 %
Lymphocytes Relative: 19 %
Lymphs Abs: 1.5 10*3/uL (ref 0.7–4.0)
MCH: 29.4 pg (ref 26.0–34.0)
MCHC: 32.8 g/dL (ref 30.0–36.0)
MCV: 89.5 fL (ref 80.0–100.0)
Monocytes Absolute: 0.5 10*3/uL (ref 0.1–1.0)
Monocytes Relative: 6 %
Neutro Abs: 5.8 10*3/uL (ref 1.7–7.7)
Neutrophils Relative %: 73 %
Platelets: 244 10*3/uL (ref 150–400)
RBC: 5.31 MIL/uL (ref 4.22–5.81)
RDW: 13.8 % (ref 11.5–15.5)
WBC: 8 10*3/uL (ref 4.0–10.5)
nRBC: 0 % (ref 0.0–0.2)

## 2019-06-12 LAB — COMPREHENSIVE METABOLIC PANEL
ALT: 15 U/L (ref 0–44)
AST: 19 U/L (ref 15–41)
Albumin: 4.2 g/dL (ref 3.5–5.0)
Alkaline Phosphatase: 85 U/L (ref 38–126)
Anion gap: 10 (ref 5–15)
BUN: 15 mg/dL (ref 8–23)
CO2: 24 mmol/L (ref 22–32)
Calcium: 8.9 mg/dL (ref 8.9–10.3)
Chloride: 101 mmol/L (ref 98–111)
Creatinine, Ser: 0.96 mg/dL (ref 0.61–1.24)
GFR calc Af Amer: >60 mL/min (ref >60)
GFR calc non Af Amer: >60 mL/min (ref >60)
Glucose, Bld: 100 mg/dL — ABNORMAL HIGH (ref 70–99)
Potassium: 4 mmol/L (ref 3.5–5.1)
Sodium: 135 mmol/L (ref 135–145)
Total Bilirubin: 0.7 mg/dL (ref 0.3–1.2)
Total Protein: 7 g/dL (ref 6.5–8.1)

## 2019-06-12 LAB — URINALYSIS, ROUTINE W REFLEX MICROSCOPIC
Bilirubin Urine: NEGATIVE
Glucose, UA: NEGATIVE mg/dL
Hgb urine dipstick: NEGATIVE
Ketones, ur: NEGATIVE mg/dL
Leukocytes,Ua: NEGATIVE
Nitrite: NEGATIVE
Protein, ur: NEGATIVE mg/dL
Specific Gravity, Urine: >1.046 — ABNORMAL HIGH (ref 1.005–1.030)
pH: 5 (ref 5.0–8.0)

## 2019-06-12 LAB — LIPASE, BLOOD: Lipase: 24 U/L (ref 11–51)

## 2019-06-12 MED ORDER — IOHEXOL 300 MG/ML  SOLN
100.0000 mL | Freq: Once | INTRAMUSCULAR | Status: AC | PRN
  Administered 2019-06-12: 16:00:00 100 mL via INTRAVENOUS

## 2019-06-12 NOTE — ED Triage Notes (Signed)
Pt presents with c/o right lower quadrant pain  And vomiting. Due to pats symptoms , encouraged to go to ED for higher level of care

## 2019-06-12 NOTE — ED Triage Notes (Signed)
Pt sent from Urgent Care due to right lower quad. Pain.  Pt has dementia, wife states pt slept all day yesterday.  Pt with emesis as well.

## 2019-06-12 NOTE — Discharge Instructions (Addendum)
Please follow-up with your primary care provider for ongoing evaluation and management.  Please drink plenty of fluids and take your medications, as directed.  I encourage a high-fiber, low-fat diet.  Return to the ED or seek immediate medical attention should you experience any new or worsening symptoms.

## 2019-06-12 NOTE — ED Provider Notes (Signed)
[facility] EMERGENCY DEPARTMENT Provider Note   CSN: [id number] Arrival date & time: 06/12/19  1350     History Chief Complaint  Patient presents with   Abdominal Pain    Luis Warner is a 69 y.o. male without medical history significant for HTN and Lewy body dementia on Aricept who presents to the ED accompanied by his wife, sent from urgent care, for acute onset RLQ abdominal pain.  Patient is a poor historian due to his dementia and thus is a 5 caveat.  He reports that he has been having right lower quadrant abdominal pain x2 weeks.  His wife substantiates this and reports that he has told her it is 7 out of 10 in terms of severity, described as "dull", waxing and waning.  She reports that his dementia has caused him to have fixations on certain aspects of his health and he has been particularly concerned about loose stools.  She does not believe that he has been having any loose stools and he confirms that he has approximately 1 bowel movement per day that is brown and "clumpy".  He had been advised to take stool softeners, which he has not due to his fear of having loose stools.  She states that his appetite has been diminished which is new for him, but has not seen him demonstrate any nausea or vomiting.  He has also experienced chills, but no documented fevers.  He slept most of the day yesterday, but evidently this is consistent with his baseline.  I personally reviewed patient's medical record and he had a colonoscopy performed recently which demonstrated polyps, hemorrhoids, and diverticulosis.  Level 5 caveat due to dementia.  HPI     Past Medical History:  Diagnosis Date   Dementia ([place])    Hypertension    Lewy body dementia ([place]) 01/21/2019   Followed by specialist at [facility] Dr. 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   Sleep behavior disorder, REM 07/21/2017    Past Surgical History:  Procedure Laterality Date   COLONOSCOPY N/A 11/15/2018   Procedure: COLONOSCOPY;  Surgeon: Rogene Houston, MD;  Location: [facility] ENDO SUITE;  Service: Endoscopy;  Laterality: N/A;  225pm   POLYPECTOMY  11/15/2018   Procedure: POLYPECTOMY;  Surgeon: Rogene Houston, MD;  Location: [facility] ENDO SUITE;  Service: Endoscopy;;  transverse colon   skin grafts     WRIST SURGERY         Family History  Family history unknown: Yes    Social History   Tobacco Use   Smoking status: Current Every Day Smoker    Packs/day: 1.50   Smokeless tobacco: Never Used  Substance Use Topics   Alcohol use: Yes    Alcohol/week: 0.0 standard drinks    Comment: occasionally   Drug use: Never    Home Medications Prior to Admission medications   Medication Sig Start Date End Date Taking? Authorizing Provider  albuterol (PROVENTIL HFA;VENTOLIN HFA) 108 (90 Base) MCG/ACT inhaler Inhale 2 puffs into the lungs every 6 (six) hours as needed for wheezing or shortness of breath. 04/21/18   Kathyrn Drown, MD  ALPRAZolam Duanne Moron) 0.25 MG tablet Take 1 tablet (0.25 mg total) by mouth 2 (two)  times daily as needed for anxiety. 11/04/18   Kathyrn Drown, MD  clonazePAM (KLONOPIN) 1 MG tablet Take 1 mg by mouth at bedtime.  02/28/14   [provider]  donepezil (ARICEPT) 5 MG tablet Take 1 pill every morning after breakfast for one month then increase to 2 pills (10 mg) and continue 12/21/18   [provider]  halobetasol (ULTRAVATE) 0.05 % ointment Apply 1 application topically daily as needed (for irritation).  07/10/17   [provider]  mirabegron ER (MYRBETRIQ) 25 MG TB24 tablet Take 1 tablet (25 mg total) by mouth daily for 28 days. 03/25/19 04/22/19  Irine Seal, MD  mirabegron ER  (MYRBETRIQ) 50 MG TB24 tablet Take 1 tablet (50 mg total) by mouth daily. Patient not taking: Reported on 03/25/2019 03/25/19   Irine Seal, MD  OLANZapine (ZYPREXA) 10 MG tablet Take 10 mg by mouth at bedtime.  09/29/17   [provider]  ondansetron (ZOFRAN) 8 MG tablet Take 1 tablet (8 mg total) by mouth every 8 (eight) hours as needed for nausea. Patient not taking: Reported on 03/25/2019 07/27/18   Kathyrn Drown, MD  sertraline (ZOLOFT) 100 MG tablet Take 200 mg by mouth at bedtime.  02/27/14   [provider]  silodosin (RAPAFLO) 8 MG CAPS capsule Take 1 capsule (8 mg total) by mouth daily. 03/25/19   Irine Seal, MD  Wheat Dextrin (BENEFIBER DRINK MIX) PACK Take 4 g by mouth at bedtime. 11/15/18   Rogene Houston, MD    Allergies    Penicillins  Review of Systems   Review of Systems  Unable to perform ROS: Dementia    Physical Exam Updated Vital Signs BP (!) 159/85    Pulse (!) 52    Temp 98.2 F (36.8 C) (Oral)    Resp 14    Ht 5\' 10"  (1.778 m)    Wt 81.6 kg    SpO2 100%    BMI 25.83 kg/m   Physical Exam Vitals and nursing note reviewed. Exam conducted with a chaperone present.  Constitutional:      Appearance: Normal appearance.  HENT:     Head: Normocephalic and atraumatic.  Eyes:     General: No scleral icterus.    Conjunctiva/sclera: Conjunctivae normal.  Cardiovascular:     Rate and Rhythm: Normal rate and regular rhythm.     Pulses: Normal pulses.     Heart sounds: Normal heart sounds.  Pulmonary:     Effort: Pulmonary effort is normal.     Breath sounds: Normal breath sounds.  Abdominal:     Comments: Soft, nondistended.  TTP in RLQ.  No significant TTP elsewhere.  No guarding.  No overlying skin changes.  Normoactive bowel sounds.  Musculoskeletal:     Cervical back: Normal range of motion.  Skin:    General: Skin is dry.  Neurological:     Mental Status: He is alert.     GCS: GCS eye subscore is 4. GCS verbal subscore is 5. GCS motor  subscore is 6.  Psychiatric:        Mood and Affect: Mood normal.        Behavior: Behavior normal.        Thought Content: Thought content normal.     ED Results / Procedures / Treatments   Labs (all labs ordered are listed, but only abnormal results are displayed) Labs Reviewed  COMPREHENSIVE METABOLIC PANEL - Abnormal; Notable for the following components:  Result Value   Glucose, Bld 100 (*)    All other components within normal limits  URINALYSIS, ROUTINE W REFLEX MICROSCOPIC - Abnormal; Notable for the following components:   Specific Gravity, Urine >1.046 (*)    All other components within normal limits  CBC WITH DIFFERENTIAL/PLATELET  LIPASE, BLOOD    EKG None  Radiology CT ABDOMEN PELVIS W CONTRAST  Result Date: 06/12/2019 CLINICAL DATA:  RIGHT lower quadrant pain, vomiting, history Lewy body dementia, hypertension EXAM: CT ABDOMEN AND PELVIS WITH CONTRAST TECHNIQUE: Multidetector CT imaging of the abdomen and pelvis was performed using the standard protocol following bolus administration of intravenous contrast. Sagittal and coronal MPR images reconstructed from axial data set. CONTRAST:  170mL OMNIPAQUE IOHEXOL 300 MG/ML SOLN IV. No oral contrast. COMPARISON:  10/09/2009 FINDINGS: Lower chest: Minimal dependent atelectasis LEFT lower lobe. Hepatobiliary: Calcified gallstone within gallbladder 19 mm diameter. No gallbladder wall thickening. Liver unremarkable. Pancreas: Normal appearance Spleen: Normal appearance Adrenals/Urinary Tract: Adrenal glands, kidneys, ureters, and bladder normal appearance Stomach/Bowel: Normal appendix. Stomach decompressed. Bowel loops unremarkable. Vascular/Lymphatic: Atherosclerotic calcifications aorta and iliac arteries. Aorta normal caliber. No adenopathy. Reproductive: Unremarkable prostate gland and seminal vesicles Other: No free air or free fluid. No hernia or inflammatory process. Musculoskeletal: Degenerative disc and facet disease  changes lumbar spine with significant neural foraminal stenosis RIGHT L3-L4. IMPRESSION: Cholelithiasis. No acute intra-abdominal or intrapelvic abnormalities. Aortic Atherosclerosis (ICD10-I70.0). Electronically Signed   By: Lavonia Dana M.D.   On: 06/12/2019 16:19    Procedures Procedures (including critical care time)  Medications Ordered in ED Medications  iohexol (OMNIPAQUE) 300 MG/ML solution 100 mL (100 mLs Intravenous Contrast Given 06/12/19 1554)    ED Course  I have reviewed the triage vital signs and the nursing notes.  Pertinent labs & imaging results that were available during my care of the patient were reviewed by me and considered in my medical decision making (see chart for details).    MDM Rules/Calculators/A&P                      Patient's history was initially concerning for diverticulitis versus appendicitis versus SBO or other acute intra-abdominal pathology.  However, given chronicity of discomfort, lower suspicion for appendicitis.  His physical exam was relatively benign aside from mild tenderness in RLQ without any guarding or overlying skin changes.  Obtained laboratory work-up that was unremarkable.  CBC demonstrates no elevated white count concerning for infection and his CMP was unremarkable.  Lipase was normal at 24 and UA demonstrated no evidence of infection.  Obtained and personally reviewed CT obtained of abdomen and pelvis which demonstrated cholelithiasis with 1.9 cm stone, but without any gallbladder wall thickening otherwise concerning for cholecystitis.  There was no right upper quadrant tenderness on my exam.  Negative Murphy sign.  I reviewed patient's comprehensive work-up with patient and his wife who were quite pleased with the findings.  Patient has been ambulating in the ER without difficulty and is not complaining of any significant pain symptoms.  Given his lack of symptoms, he had not been treated with any pain medication or antiemetics.     This patient presents with abdominal pain of unclear etiology. A CT scan was performed to evaluate for potential causes of the abdominal pain, however, neither the clinical exam nor the CT has identified an emergent etiology for the abdominal pain. Specifically, given the benign exam, the laboratory studies, and unremarkable CT, I have a very low suspicion for appendicitis, ischemic  bowel, bowel perforation, or any other life threatening disease. I have discussed with the patient the level of uncertainty with undifferentiated abdominal pain.  Encouraging patient to follow-up with his primary care provider for ongoing evaluation and management.  Strict ED return precautions discussed.  All of the evaluation and work-up results were discussed with the patient and any family at bedside. They were provided opportunity to ask any additional questions and have none at this time. They have expressed understanding of verbal discharge instructions as well as return precautions and are agreeable to the plan.    Final Clinical Impression(s) / ED Diagnoses Final diagnoses:  RLQ abdominal pain    Rx / DC Orders ED Discharge Orders    None       Corena Herter, PA-C 06/12/19 1729    Fredia Sorrow, MD 06/16/19 2048

## 2019-06-20 ENCOUNTER — Telehealth: Payer: Self-pay | Admitting: Family Medicine

## 2019-06-20 NOTE — Telephone Encounter (Signed)
Certainly if we will be happy to see him right away otherwise I would recommend a office visit toward the end of this month as a follow-up on how things are going in a general sense Certainly he can be worked in sooner than that if any acute issues

## 2019-06-20 NOTE — Telephone Encounter (Signed)
Wife (DPR) scheduled office visit tomorrow with Dr Nicki Reaper for evaluation.

## 2019-06-20 NOTE — Telephone Encounter (Signed)
Wife states it is hard to know what is going on with patient with his dementia. He says he has diarrhea but she has seen no signs and doesn't know if he really is or if he thinks he is in his mind- he doesn't want to eat as much as normal but again she doesn't know if it is related to his dementia or a stomach issue.

## 2019-06-20 NOTE — Telephone Encounter (Signed)
Please find out a little more information When the family states he is not eating like he should what do they mean?  I do not recommend more lab work at this point (Essentially trying to figure out if he needs to be seen today or tomorrow or at the end of the month or with Dr. Lovena Le)

## 2019-06-20 NOTE — Telephone Encounter (Signed)
Wife calling, Patient has dementia so she is having a hard time trying to tell if his symptoms are real or in his head. He went to the ER about a week ago for right sided pain and diarrhea  They did a full work up on him and really couldn't find anything.  She said he is still complaining of diarrhea(even though she isn't sure he really has diarrhea) but she did say he is not eating like he should be.  She isn't sure what she should be doing with him. More labwork?  Dr. Nicki Reaper really doesn't have any appts left so I wasn't sure how to schedule this patient?

## 2019-06-21 ENCOUNTER — Other Ambulatory Visit: Payer: Self-pay

## 2019-06-21 ENCOUNTER — Ambulatory Visit: Payer: Medicare HMO | Admitting: Family Medicine

## 2019-06-21 VITALS — BP 114/72 | HR 98 | Temp 97.9°F | Wt 168.2 lb

## 2019-06-21 DIAGNOSIS — R1084 Generalized abdominal pain: Secondary | ICD-10-CM

## 2019-06-21 DIAGNOSIS — F0151 Vascular dementia with behavioral disturbance: Secondary | ICD-10-CM | POA: Diagnosis not present

## 2019-06-21 DIAGNOSIS — G3183 Dementia with Lewy bodies: Secondary | ICD-10-CM | POA: Diagnosis not present

## 2019-06-21 DIAGNOSIS — F0281 Dementia in other diseases classified elsewhere with behavioral disturbance: Secondary | ICD-10-CM | POA: Diagnosis not present

## 2019-06-21 DIAGNOSIS — F01518 Vascular dementia, unspecified severity, with other behavioral disturbance: Secondary | ICD-10-CM

## 2019-06-21 MED ORDER — PANTOPRAZOLE SODIUM 40 MG PO TBEC: 40.0000 mg | DELAYED_RELEASE_TABLET | Freq: Every day | ORAL | 3 refills | Status: DC

## 2019-06-21 MED ORDER — DICYCLOMINE HCL 10 MG PO CAPS: ORAL_CAPSULE | ORAL | 2 refills | Status: DC

## 2019-06-21 NOTE — Progress Notes (Signed)
Subjective:    Patient ID: Luis Warner, male    DOB: 11/21/1950, 69 y.o.   MRN: [id number]  HPI Patient comes in today for follow up on ED visit for abdominal pain.  Patient relates intermittent abdominal pain and discomfort.  Denies rectal bleeding.  Intermittent diarrhea.  Difficult to know the full extent of his issues because of his dementia I dependent upon the wife for the majority of the information but the patient also gives information that the wife states she is uncertain if it is accurate he denies any type of chest tightness pressure pain does get a little short winded with activity but not severe  Patient has complaints of diarrhea and SOB on exertion.  Dementia getting worse.  Having a lot of faulty thinking and difficult time putting things together   Review of Systems  Constitutional: Negative for activity change.  HENT: Negative for congestion and rhinorrhea.   Respiratory: Negative for cough and shortness of breath.   Cardiovascular: Negative for chest pain.  Gastrointestinal: Positive for abdominal pain and diarrhea. Negative for nausea and vomiting.  Genitourinary: Negative for dysuria and hematuria.  Neurological: Negative for weakness and headaches.  Psychiatric/Behavioral: Negative for behavioral problems and confusion.       Objective:   Physical Exam Vitals reviewed.  Constitutional:      General: He is not in acute distress. HENT:     Head: Normocephalic and atraumatic.  Eyes:     General:        Right eye: No discharge.        Left eye: No discharge.  Neck:     Trachea: No tracheal deviation.  Cardiovascular:     Rate and Rhythm: Normal rate and regular rhythm.     Heart sounds: Normal heart sounds. No murmur.  Pulmonary:     Effort: Pulmonary effort is normal. No respiratory distress.     Breath sounds: Normal breath sounds.  Abdominal:     Palpations: Abdomen is soft. There is no mass.     Hernia: No hernia is present.  Lymphadenopathy:      Cervical: No cervical adenopathy.  Skin:    General: Skin is warm and dry.  Neurological:     Mental Status: He is alert.     Coordination: Coordination normal.  Psychiatric:        Behavior: Behavior normal.    Subjective abdominal pain but no guarding or rebound  30 minutes spent between discussing labs CAT scan ER follow-up medications current situation Fall Risk  04/21/2018 10/05/2015  Falls in the past year? 0 Yes  Comment - Emmi Telephone Survey: data to providers prior to load  Number falls in past yr: - 2 or more  Comment - Emmi Telephone Survey Actual Response = 5  Injury with Fall? - Yes       Assessment & Plan:  1. Generalized abdominal pain This is mainly in the lower abdomen his CAT scan and lab work from the ER overall looked acceptable find no evidence of tumor or mass or growth I would recommend dicyclomine 3 times daily as needed abdominal discomfort we will also go ahead and cover for gastritis with Protonix on a daily basis plus also follow-up again in 2 to 3 weeks hold off on GI referral scoping or additional blood work at this time  Significant dementia issues  2. Dementia, multiinfarct, with behavioral disturbance ([place]) Significant dementia issues.  Unfortunately long-term prognosis poor but family doing a good  job being supportive on medications follows with a specialist  3. Lewy body dementia with behavioral disturbance ([place]) See above  Specialist feels that his dementia is multifactorial more likely Lewy body but also possible multi-infarct

## 2019-06-24 ENCOUNTER — Ambulatory Visit: Payer: Medicare HMO | Admitting: Urology

## 2019-07-05 ENCOUNTER — Ambulatory Visit: Payer: Medicare HMO | Admitting: Family Medicine

## 2019-07-05 ENCOUNTER — Other Ambulatory Visit: Payer: Self-pay

## 2019-07-05 ENCOUNTER — Encounter: Payer: Self-pay | Admitting: Family Medicine

## 2019-07-05 VITALS — BP 128/72 | Temp 97.3°F | Ht 70.0 in | Wt 165.0 lb

## 2019-07-05 DIAGNOSIS — F0151 Vascular dementia with behavioral disturbance: Secondary | ICD-10-CM | POA: Diagnosis not present

## 2019-07-05 DIAGNOSIS — R1084 Generalized abdominal pain: Secondary | ICD-10-CM | POA: Diagnosis not present

## 2019-07-05 DIAGNOSIS — F01518 Vascular dementia, unspecified severity, with other behavioral disturbance: Secondary | ICD-10-CM

## 2019-07-05 MED ORDER — ALPRAZOLAM 0.25 MG PO TABS: 0.2500 mg | ORAL_TABLET | Freq: Two times a day (BID) | ORAL | 0 refills | Status: DC | PRN

## 2019-07-05 NOTE — Progress Notes (Addendum)
   Subjective:    Patient ID: Luis Warner, male    DOB: 1950/12/01, 69 y.o.   MRN: [id number]  HPIFollow up on abdominal pain.   Wife Luis Warner states he is not eating well and sleeps all the time.   Wife states she had to give him some xanax that was prescribed about one year ago.   Fall Risk  06/21/2019 04/21/2018 10/05/2015  Falls in the past year? 0 0 Yes  Comment - - Emmi Telephone Survey: data to providers prior to load  Number falls in past yr: 0 - 2 or more  Comment - - Emmi Telephone Survey Actual Response = 5  Injury with Fall? - - Yes  Risk for fall due to : Impaired balance/gait;Impaired mobility;Mental status change - -  Follow up Falls evaluation completed;Education provided - -     Review of Systems  Constitutional: Negative for activity change, appetite change and fatigue.  HENT: Negative for congestion and rhinorrhea.   Respiratory: Negative for cough and shortness of breath.   Cardiovascular: Negative for chest pain and leg swelling.  Gastrointestinal: Negative for abdominal pain, nausea and vomiting.  Neurological: Negative for dizziness and headaches.  Psychiatric/Behavioral: Negative for agitation and behavioral problems.       Objective:   Physical Exam Vitals reviewed.  Constitutional:      General: He is not in acute distress. HENT:     Head: Normocephalic and atraumatic.  Eyes:     General:        Right eye: No discharge.        Left eye: No discharge.  Neck:     Trachea: No tracheal deviation.  Cardiovascular:     Rate and Rhythm: Normal rate and regular rhythm.     Heart sounds: Normal heart sounds. No murmur.  Pulmonary:     Effort: Pulmonary effort is normal. No respiratory distress.     Breath sounds: Normal breath sounds.  Lymphadenopathy:     Cervical: No cervical adenopathy.  Skin:    General: Skin is warm and dry.  Neurological:     Mental Status: He is alert.     Coordination: Coordination normal.  Psychiatric:        Behavior:  Behavior normal.           Assessment & Plan:  Anxiousness restlessness they will discuss with their neurologist who is working with his dementia refill of Xanax given.  Severe progressive dementia makes it difficult for his wife to take care of him but she is doing a good job  Almost impossible to discern level of intestinal symptoms because of his dementia but the best I can tell him nothing severely acute but we will monitor his weight if it continues to go down he may well need to have an EGD to rule out stomach pathology more than likely his decreased appetite is related into his dementia and his fixation on intermittent diarrhea I certainly sympathize with the family and the patient  Recheck 4 weeks

## 2019-08-02 ENCOUNTER — Other Ambulatory Visit: Payer: Self-pay

## 2019-08-02 ENCOUNTER — Encounter: Payer: Self-pay | Admitting: Family Medicine

## 2019-08-02 ENCOUNTER — Ambulatory Visit (INDEPENDENT_AMBULATORY_CARE_PROVIDER_SITE_OTHER): Payer: Medicare HMO | Admitting: Family Medicine

## 2019-08-02 VITALS — BP 142/86 | Temp 97.2°F | Wt 168.0 lb

## 2019-08-02 DIAGNOSIS — R1084 Generalized abdominal pain: Secondary | ICD-10-CM | POA: Diagnosis not present

## 2019-08-02 DIAGNOSIS — K580 Irritable bowel syndrome with diarrhea: Secondary | ICD-10-CM

## 2019-08-02 MED ORDER — DICYCLOMINE HCL 10 MG PO CAPS: ORAL_CAPSULE | ORAL | 5 refills | Status: DC

## 2019-08-02 MED ORDER — SILODOSIN 4 MG PO CAPS: 4.0000 mg | ORAL_CAPSULE | Freq: Every day | ORAL | 5 refills | Status: DC

## 2019-08-02 NOTE — Progress Notes (Signed)
   Subjective:    Patient ID: Luis Warner, male    DOB: 1950-12-05, 69 y.o.   MRN: [id number]  HPI Pt here today for follow up on abdominal pain. Pt was in office on 07/05/19. Pt states his abdominal pain is the same. Pt still having pain in right lower quadrant. Has had some "rumbling" in stomach and loose stools.   Pt also having some bouts of dizziness. Pt went out to eat Saturday and upon leaving, pt fell down due to weakness. Also experiencing some shortness of breath with activity. When pt arrived home, his legs gave out on him.   Patient states at times he gets so weak his legs give out from underneath him He denies passing out Denies headaches It is difficult for the patient to get a concise history because of his dementia His wife does the best she can She states she does not witness his bowel movements or urination He does state his urination seems to be doing okay Also relates at times he has loose stools but it has a hard time quantifying this and wife cannot verify no bloody stools Review of Systems     Objective:   Physical Exam Lungs clear respiratory rate normal heart rate controlled extremities no edema skin warm dry abdomen is soft no guarding or rebound Blood pressure was checked lying sitting standing there is a significant drop with standing  I do not feel that this is syncope I feel that this is presyncope with orthostatic hypotension I do not feel he needs a additional neurologic work-up or cardiac work-up at this point    Assessment & Plan:  Dementia progressive follows up with specialist every several months  Urinary hesitancy along with restricted flow sees urology on a regular basis reduce the medication from 8 mg down to 4 mg because of orthostatic hypotension  Probable IBS symptoms I do not feel that the patient would benefit from scoping or lab work or MRI or CAT scan at this point I recommend dicyclomine 3 times daily as needed we will also send a copy  of this to his gastroenterologist to see if they feel he needs any further testing  Orthostatic hypotension with weakness in the legs I believe the medication is contributing to that reduce the medication follow-up again in 12 weeks time follow-up sooner problems

## 2019-08-26 ENCOUNTER — Ambulatory Visit: Payer: Medicare HMO | Admitting: Urology

## 2019-08-30 NOTE — Progress Notes (Signed)
See the previous note

## 2019-08-30 NOTE — Progress Notes (Signed)
Dr. Nicki Reaper Luking's note appreciated. I last saw patient for colonoscopy in October 2020. Tammy please call patient and or his wife and find out if needs to be seen in our office.

## 2019-08-30 NOTE — Progress Notes (Signed)
I talked with the patient's wife , Luis Warner. She feels that the Dicyclomine is working. She also feels that her husband's dementia  (psychological) is making him feel that it is worse. He does go a few days and may not have a BM then he will go. He has become fearful of going out in public because he does not want to have a BM.  Luis Warner states that she will call our office if he needs to be seen or significant changes.

## 2019-09-19 ENCOUNTER — Other Ambulatory Visit: Payer: Self-pay | Admitting: Family Medicine

## 2019-09-20 ENCOUNTER — Telehealth: Payer: Self-pay | Admitting: Family Medicine

## 2019-09-20 NOTE — Telephone Encounter (Signed)
Contacted [facility]. Dr.Bateman has put pt on Clonazepam 1 mg 1 /2 tab each night. Pt has been on Clonazepam before but pharmacist is needing to touch base with PCP to make sure he is aware and is OK with pt being on both meds. Please advise. Thank you

## 2019-09-20 NOTE — Telephone Encounter (Signed)
Pt called pharmacy and RX for ALPRAZolam Duanne Moron) 0.25 MG tablet and [facility] on [address] said they need to speak to Dr Nicki Reaper because he is taking clonazepam as well.   [place] Call Back [phone] Pt call back [phone]

## 2019-09-22 ENCOUNTER — Other Ambulatory Visit: Payer: Self-pay | Admitting: Family Medicine

## 2019-09-22 NOTE — Telephone Encounter (Signed)
1.  I am fine with the patient being on clonazepam at nighttime to try to help with sleep, I did check the PDMP after they called [place] thank you Also let pharmacist know that as for the alprazolam it is for infrequent use during the daytime Let pharmacist know patient has dementia with behavioral issues Also let pharmacist know that we will communicate with his wife to make sure she understands never to give the Xanax close to bedtime due to potential interactions with clonazepam  Nurses also communicate with his wife the above I am fine with him using the clonazepam at nighttime but the alprazolam should be infrequently and only during the day Please keep follow-up in September thank you

## 2019-09-22 NOTE — Telephone Encounter (Signed)
Discussed with wife (DPR). Patient verbalized understanding and stated he has been on both meds for a long time and she uses the xanax very infrequently.

## 2019-09-22 NOTE — Telephone Encounter (Signed)
Left message on pharmacist line at pharmacy. Left message on wife cell phone to return call

## 2019-09-29 ENCOUNTER — Ambulatory Visit (INDEPENDENT_AMBULATORY_CARE_PROVIDER_SITE_OTHER): Payer: Medicare HMO | Admitting: Gastroenterology

## 2019-10-03 ENCOUNTER — Encounter (INDEPENDENT_AMBULATORY_CARE_PROVIDER_SITE_OTHER): Payer: Self-pay | Admitting: Gastroenterology

## 2019-10-03 ENCOUNTER — Other Ambulatory Visit: Payer: Self-pay

## 2019-10-03 ENCOUNTER — Ambulatory Visit (INDEPENDENT_AMBULATORY_CARE_PROVIDER_SITE_OTHER): Payer: Medicare HMO | Admitting: Gastroenterology

## 2019-10-03 VITALS — BP 135/79 | HR 66 | Temp 98.2°F | Ht 69.0 in | Wt 162.9 lb

## 2019-10-03 DIAGNOSIS — R159 Full incontinence of feces: Secondary | ICD-10-CM | POA: Diagnosis not present

## 2019-10-03 NOTE — Patient Instructions (Signed)
-   Try benefiber or citrucel supplement over the counter and probiotic (recommend [employer] or Align) - take these daily. Add in dicyclomine as needed before trigger meals as discussed. Try to limit caffeine (tea and sodas).

## 2019-10-03 NOTE — Progress Notes (Signed)
Patient profile: Luis Warner is a 69 y.o. male seen for evaluation of incontinence. Last seen in 11/2019 for eval of rectal bleeding.   History of Present Illness: Luis Warner is seen today for fecal incontinence - happens several times a week over past few months, wife reporst a fear of eating due to urgency and incontinence at times. No abd pain w/ fear of eating. Seems to be most problematic if going out to eat. Wife reports occasional abd pain w/ the urgency and incontinence but overall mild. No noted blood in stool. Stool consistency can vary from occasional constipation requiring laxatives but usually loose liquid stools.   Dr Wolfgang Phoenix recommended trying dicyclomine, wife feels this helped along w/ adding anxiety medication but patient is hesitant to take dicyclomine at times. Also hesistant to take metamucil.    No nausea/vomiting. Occcasional GERD symptoms w/ burping, has been prescribed protonix in past but didn't take given symptoms mild. No dysphagia.   Smokes 2 cig/day. Very infrequent alcohol few times a year. No NSAIDs.   Wt Readings from Last 3 Encounters:  10/03/19 162 lb 14.4 oz (73.9 kg)  08/02/19 168 lb (76.2 kg)  07/05/19 165 lb (74.8 kg)  10/2018-#170    Last Colonoscopy: 11/2018-- Diverticulosis in the sigmoid colon and in the ascending colon. - One 5 mm polyp in the mid transverse colon, removed with a cold snare. Resected and retrieved. - Internal hemorrhoids.  Polyp is a tubular adenoma. Results given to patient's wife. May consider next colonoscopy in 7 years.     Past Medical History:  Past Medical History:  Diagnosis Date  . Dementia ([place])   . Hypertension   . Lewy body dementia ([place]) 01/21/2019   Followed by specialist at [facility] Dr. Georgetta Haber    Problem List: Patient Active Problem List   Diagnosis Date Noted  . Lewy body dementia ([place]) 01/21/2019  . Rectal bleeding 11/11/2018  . Nausea 07/27/2018  . Dementia, multiinfarct, with  behavioral disturbance ([place]) 07/21/2017  . Anxiety disorder due to brain injury 07/21/2017  . 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 Sleep behavior disorder, REM 07/21/2017    Past Surgical History: Past Surgical History:  Procedure Laterality Date  . COLONOSCOPY N/A 11/15/2018   Procedure: COLONOSCOPY;  Surgeon: Rogene Houston, MD;  [facility];  Service: Endoscopy;  Laterality: N/A;  225pm  . POLYPECTOMY  11/15/2018   Procedure: POLYPECTOMY;  Surgeon: Rogene Houston, MD;  Location[facility];  Service: Endoscopy;;  transverse colon  . skin grafts    . WRIST SURGERY      Allergies: Allergies  Allergen Reactions  . Penicillins Swelling    Childhood allergy, swelling Did it involve swelling of the face/tongue/throat, SOB, or low BP? Unknown Did it involve sudden or severe rash/hives, skin peeling, or any reaction on the inside of your mouth or nose? Unknown Did you need to seek medical attention at a hospital or doctor's office? Unknown When did it last happen?childhood If all above answers are "NO", may proceed with cephalosporin use.       Home Medications:  Current Outpatient Medications:  .  albuterol (PROVENTIL HFA;VENTOLIN HFA) 108 (90 Base) MCG/ACT inhaler, Inhale 2 puffs into the lungs every 6 (six) hours as needed for wheezing or shortness of breath., Disp: 1 Inhaler, Rfl: 4 .  ALPRAZolam (XANAX) 0.25 MG tablet, TAKE 1 TABLET(0.25 MG) BY MOUTH TWICE DAILY AS NEEDED FOR ANXIETY, Disp: 20 tablet, Rfl: 0 .  clonazePAM (KLONOPIN) 1 MG tablet, Take 1 mg by mouth at bedtime. , Disp: , Rfl: 1 .  dicyclomine (BENTYL) 10 MG capsule, 1 tablet 3 times daily as needed for abdominal pain, Disp: 90 capsule, Rfl: 5 .  memantine (NAMENDA) 10 MG tablet, 10 mg 2 (two) times daily. , Disp: , Rfl:  .  sertraline (ZOLOFT) 100 MG tablet, Take 200 mg by mouth at bedtime. , Disp: , Rfl: 0 .  silodosin (RAPAFLO) 4 MG CAPS capsule, Take 1 capsule (4 mg total) by mouth daily.,  Disp: 30 capsule, Rfl: 5 .  pantoprazole (PROTONIX) 40 MG tablet, Take 1 tablet (40 mg total) by mouth daily. (Patient not taking: Reported on 10/03/2019), Disp: 30 tablet, Rfl: 3   Family History: Family history is unknown by patient.    Social History:   reports that he has been smoking. He has been smoking about 1.50 packs per day. He has never used smokeless tobacco. He reports current alcohol use. He reports that he does not use drugs.   Review of Systems: Constitutional: Denies weight loss/weight gain  Eyes: No changes in vision. ENT: No oral lesions, sore throat.  GI: see HPI.  Heme/Lymph: No easy bruising.  CV: No chest pain.  GU: No hematuria.  Integumentary: No rashes.  Neuro: No headaches.  Psych: No depression/anxiety.  Endocrine: No heat/cold intolerance.  Allergic/Immunologic: No urticaria.  Resp: No cough, SOB.  Musculoskeletal: No joint swelling.    Physical Examination: BP 135/79 (BP Location: Right Arm, Patient Position: Sitting, Cuff Size: Large)   Pulse 66   Temp 98.2 F (36.8 C) (Oral)   Ht 5\' 9"  (1.753 m)   Wt 162 lb 14.4 oz (73.9 kg)   BMI 24.06 kg/m  Gen: NAD, alert and oriented x 4-difficulty w recall of specific details.  HEENT: PEERLA, EOMI, Neck: supple, no JVD Chest: CTA bilaterally, no wheezes, crackles, or other adventitious sounds CV: RRR, no m/g/c/r Abd: soft, NT, ND, +BS in all four quadrants; no HSM, guarding, ridigity, or rebound tenderness Ext: no edema, well perfused with 2+ pulses, Skin: no rash or lesions noted on observed skin Lymph: no noted LAD  Data Reviewed:  CT 06/2019-IMPRESSION: Cholelithiasis. No acute intra-abdominal or intrapelvic abnormalities. Aortic Atherosclerosis (ICD10-I70.0).   Labs 06/2019--CBC, CMP, lipase normal.    Assessment/Plan: Luis Warner is a 69 y.o. male seen for evaluation of fecal incontinence   1.  Fecal incontinence with urgency-suspect this is worsening recently given patient's worsening  underlying dementia.  Seems to alternate with very rare constipation so we will hold off on bile acid binder but could consider in future to bulk stool.  Will initially try regimen of fiber supplement and probiotics.  Wife seems to think dicyclomine helps prevent episodes and will try to use this on an as-needed basis particularly going out to eat. Can also try imodium pRN. Colonoscopy and CT within the last year have been unremarkable.  Lower suspicion for infection, pancreatic insufficiency etc. given alternating occasional constipation but wife to notify me if diarrhea worsens and would consider stool studies.  Patient will try to decrease coffee and sodas as this may aggravate symptoms  Wife will call for follow-up as needed if plan does not help.  We will also monitor his weight and notify me if loses weight.   Luis Warner was seen today for follow-up.  Diagnoses and all orders for this visit:  Incontinence of feces, unspecified fecal incontinence type     Pt instructions:  - Try  benefiber or citrucel supplement over the counter and probiotic (recommend [employer] or [employer]) - take these daily. Add in dicyclomine as needed before trigger meals as discussed. Try to limit caffeine (tea and sodas).    25 min face to face time.     I personally performed the service, non-incident to. (WP)  Laurine Blazer, [facility]

## 2019-10-07 ENCOUNTER — Ambulatory Visit: Payer: Medicare HMO | Admitting: Urology

## 2019-11-02 ENCOUNTER — Other Ambulatory Visit: Payer: Self-pay

## 2019-11-02 ENCOUNTER — Encounter: Payer: Self-pay | Admitting: Family Medicine

## 2019-11-02 ENCOUNTER — Ambulatory Visit: Payer: Medicare HMO | Admitting: Family Medicine

## 2019-11-02 VITALS — BP 128/82 | Temp 97.7°F | Ht 69.0 in | Wt 165.2 lb

## 2019-11-02 DIAGNOSIS — F0281 Dementia in other diseases classified elsewhere with behavioral disturbance: Secondary | ICD-10-CM | POA: Diagnosis not present

## 2019-11-02 DIAGNOSIS — K580 Irritable bowel syndrome with diarrhea: Secondary | ICD-10-CM | POA: Diagnosis not present

## 2019-11-02 DIAGNOSIS — G3183 Dementia with Lewy bodies: Secondary | ICD-10-CM

## 2019-11-02 DIAGNOSIS — Z23 Encounter for immunization: Secondary | ICD-10-CM

## 2019-11-02 DIAGNOSIS — R1084 Generalized abdominal pain: Secondary | ICD-10-CM

## 2019-11-02 MED ORDER — ALPRAZOLAM 0.25 MG PO TABS: ORAL_TABLET | ORAL | 1 refills | Status: DC

## 2019-11-02 NOTE — Progress Notes (Signed)
   Subjective:    Patient ID: Luis Warner, male    DOB: 22-Apr-1950, 69 y.o.   MRN: [id number]  HPI  Patient arrives for a follow up on IBS. Patient has seen GI and is doing better since stopping silodosin. Patient been doing fairly well recently has had some setbacks due to mild progression of his Lewy body dementia.  Wife is doing a good job of working with him.  He does see his specialist.  Is on his medicines. Review of Systems  Constitutional: Negative for activity change.  HENT: Negative for congestion and rhinorrhea.   Respiratory: Negative for cough and shortness of breath.   Cardiovascular: Negative for chest pain.  Gastrointestinal: Positive for abdominal pain. Negative for diarrhea, nausea and vomiting.  Genitourinary: Negative for dysuria and hematuria.  Neurological: Negative for weakness and headaches.  Psychiatric/Behavioral: Negative for behavioral problems and confusion.       Objective:   Physical Exam Vitals reviewed.  Constitutional:      General: He is not in acute distress. HENT:     Head: Normocephalic and atraumatic.  Eyes:     General:        Right eye: No discharge.        Left eye: No discharge.  Neck:     Trachea: No tracheal deviation.  Cardiovascular:     Rate and Rhythm: Normal rate and regular rhythm.     Heart sounds: Normal heart sounds. No murmur heard.   Pulmonary:     Effort: Pulmonary effort is normal. No respiratory distress.     Breath sounds: Normal breath sounds.  Lymphadenopathy:     Cervical: No cervical adenopathy.  Skin:    General: Skin is warm and dry.  Neurological:     Mental Status: He is alert.     Coordination: Coordination normal.  Psychiatric:        Behavior: Behavior normal.    abd soft  Fall Risk  06/21/2019 04/21/2018 10/05/2015  Falls in the past year? 0 0 Yes  Comment - - Emmi Telephone Survey: data to providers prior to load  Number falls in past yr: 0 - 2 or more  Comment - - Emmi Telephone Survey  Actual Response = 5  Injury with Fall? - - Yes  Risk for fall due to : Impaired balance/gait;Impaired mobility;Mental status change - -  Follow up Falls evaluation completed;Education provided - -        Assessment & Plan:  1. Need for vaccination Flu shot - Flu Vaccine QUAD High Dose(Fluad)  2. Lewy body dementia with behavioral disturbance ([place]) Continue current meds see specialist ongoing stable  3. Generalized abdominal pain Related to IBS inter mittent Tolerates dycyclomine well  4. Irritable bowel syndrome with diarrhea See above Recheck 6 months

## 2019-11-25 ENCOUNTER — Ambulatory Visit: Payer: Medicare HMO | Admitting: Urology

## 2019-12-05 ENCOUNTER — Telehealth (INDEPENDENT_AMBULATORY_CARE_PROVIDER_SITE_OTHER): Payer: Self-pay

## 2019-12-05 ENCOUNTER — Ambulatory Visit (INDEPENDENT_AMBULATORY_CARE_PROVIDER_SITE_OTHER): Payer: Medicare HMO | Admitting: Gastroenterology

## 2019-12-05 NOTE — Telephone Encounter (Signed)
Noted. Thank you for update!

## 2019-12-05 NOTE — Telephone Encounter (Signed)
Patient called the day of the appt with Laurine Blazer to cancel the appt for today 12/05/2019.

## 2020-01-14 ENCOUNTER — Encounter (HOSPITAL_COMMUNITY): Payer: Self-pay | Admitting: Emergency Medicine

## 2020-01-14 ENCOUNTER — Emergency Department (HOSPITAL_COMMUNITY)
Admission: EM | Admit: 2020-01-14 | Discharge: 2020-01-17 | Disposition: A | Discharge status: Home / Self Care | Payer: Medicare HMO | Attending: Emergency Medicine | Admitting: Emergency Medicine

## 2020-01-14 ENCOUNTER — Other Ambulatory Visit: Payer: Self-pay

## 2020-01-14 DIAGNOSIS — W260XXA Contact with knife, initial encounter: Secondary | ICD-10-CM | POA: Diagnosis not present

## 2020-01-14 DIAGNOSIS — S069XAA Unspecified intracranial injury with loss of consciousness status unknown, initial encounter: Secondary | ICD-10-CM | POA: Clinically undetermined

## 2020-01-14 DIAGNOSIS — F17210 Nicotine dependence, cigarettes, uncomplicated: Secondary | ICD-10-CM | POA: Diagnosis not present

## 2020-01-14 DIAGNOSIS — R4588 Nonsuicidal self-harm: Secondary | ICD-10-CM | POA: Diagnosis not present

## 2020-01-14 DIAGNOSIS — S199XXA Unspecified injury of neck, initial encounter: Secondary | ICD-10-CM | POA: Diagnosis present

## 2020-01-14 DIAGNOSIS — Z046 Encounter for general psychiatric examination, requested by authority: Secondary | ICD-10-CM | POA: Diagnosis not present

## 2020-01-14 DIAGNOSIS — F0281 Dementia in other diseases classified elsewhere with behavioral disturbance: Secondary | ICD-10-CM | POA: Diagnosis not present

## 2020-01-14 DIAGNOSIS — Z20822 Contact with and (suspected) exposure to covid-19: Secondary | ICD-10-CM | POA: Diagnosis not present

## 2020-01-14 DIAGNOSIS — F0391 Unspecified dementia with behavioral disturbance: Secondary | ICD-10-CM

## 2020-01-14 DIAGNOSIS — F17200 Nicotine dependence, unspecified, uncomplicated: Secondary | ICD-10-CM | POA: Insufficient documentation

## 2020-01-14 DIAGNOSIS — F0390 Unspecified dementia without behavioral disturbance: Secondary | ICD-10-CM | POA: Insufficient documentation

## 2020-01-14 DIAGNOSIS — I10 Essential (primary) hypertension: Secondary | ICD-10-CM | POA: Insufficient documentation

## 2020-01-14 DIAGNOSIS — S1091XA Abrasion of unspecified part of neck, initial encounter: Secondary | ICD-10-CM | POA: Insufficient documentation

## 2020-01-14 DIAGNOSIS — S069X9A Unspecified intracranial injury with loss of consciousness of unspecified duration, initial encounter: Secondary | ICD-10-CM | POA: Clinically undetermined

## 2020-01-14 DIAGNOSIS — G3183 Dementia with Lewy bodies: Secondary | ICD-10-CM | POA: Diagnosis not present

## 2020-01-14 LAB — COMPREHENSIVE METABOLIC PANEL
ALT: 14 U/L (ref 0–44)
AST: 18 U/L (ref 15–41)
Albumin: 3.9 g/dL (ref 3.5–5.0)
Alkaline Phosphatase: 79 U/L (ref 38–126)
Anion gap: 9 (ref 5–15)
BUN: 15 mg/dL (ref 8–23)
CO2: 23 mmol/L (ref 22–32)
Calcium: 8.7 mg/dL — ABNORMAL LOW (ref 8.9–10.3)
Chloride: 103 mmol/L (ref 98–111)
Creatinine, Ser: 0.96 mg/dL (ref 0.61–1.24)
GFR, Estimated: >60 mL/min (ref >60)
Glucose, Bld: 92 mg/dL (ref 70–99)
Potassium: 3.7 mmol/L (ref 3.5–5.1)
Sodium: 135 mmol/L (ref 135–145)
Total Bilirubin: 0.8 mg/dL (ref 0.3–1.2)
Total Protein: 6.6 g/dL (ref 6.5–8.1)

## 2020-01-14 LAB — CBC
HCT: 43.8 % (ref 39.0–52.0)
Hemoglobin: 14.8 g/dL (ref 13.0–17.0)
MCH: 30.1 pg (ref 26.0–34.0)
MCHC: 33.8 g/dL (ref 30.0–36.0)
MCV: 89.2 fL (ref 80.0–100.0)
Platelets: 200 10*3/uL (ref 150–400)
RBC: 4.91 MIL/uL (ref 4.22–5.81)
RDW: 13.6 % (ref 11.5–15.5)
WBC: 11.9 10*3/uL — ABNORMAL HIGH (ref 4.0–10.5)
nRBC: 0 % (ref 0.0–0.2)

## 2020-01-14 LAB — RAPID URINE DRUG SCREEN, HOSP PERFORMED
Amphetamines: NOT DETECTED
Barbiturates: NOT DETECTED
Benzodiazepines: POSITIVE — AB
Cocaine: NOT DETECTED
Opiates: NOT DETECTED
Tetrahydrocannabinol: NOT DETECTED

## 2020-01-14 LAB — ETHANOL: Alcohol, Ethyl (B): <10 mg/dL (ref <10)

## 2020-01-14 LAB — ACETAMINOPHEN LEVEL: Acetaminophen (Tylenol), Serum: <10 ug/mL — ABNORMAL LOW (ref 10–30)

## 2020-01-14 LAB — SALICYLATE LEVEL: Salicylate Lvl: <7 mg/dL — ABNORMAL LOW (ref 7.0–30.0)

## 2020-01-14 MED ORDER — MEMANTINE HCL 10 MG PO TABS
10.0000 mg | ORAL_TABLET | Freq: Two times a day (BID) | ORAL | Status: DC
  Administered 2020-01-14 – 2020-01-17 (×6): 10 mg via ORAL
  Filled 2020-01-14 (×6): qty 1

## 2020-01-14 MED ORDER — ZOLPIDEM TARTRATE 5 MG PO TABS: 5.0000 mg | ORAL_TABLET | Freq: Every evening | ORAL | Status: DC | PRN

## 2020-01-14 MED ORDER — SERTRALINE HCL 50 MG PO TABS
200.0000 mg | ORAL_TABLET | Freq: Every day | ORAL | Status: DC
  Administered 2020-01-14 – 2020-01-16 (×3): 200 mg via ORAL
  Filled 2020-01-14 (×3): qty 4

## 2020-01-14 MED ORDER — LORAZEPAM 1 MG PO TABS
1.0000 mg | ORAL_TABLET | ORAL | Status: AC | PRN
  Administered 2020-01-16: 1 mg via ORAL
  Filled 2020-01-14: qty 1

## 2020-01-14 MED ORDER — CLONAZEPAM 0.5 MG PO TABS
1.0000 mg | ORAL_TABLET | Freq: Every day | ORAL | Status: DC
  Administered 2020-01-14 – 2020-01-16 (×3): 1 mg via ORAL
  Filled 2020-01-14 (×3): qty 2

## 2020-01-14 MED ORDER — BACITRACIN ZINC 500 UNIT/GM EX OINT
TOPICAL_OINTMENT | Freq: Once | CUTANEOUS | Status: AC
  Administered 2020-01-14: 1 via TOPICAL
  Filled 2020-01-14: qty 0.9

## 2020-01-14 MED ORDER — ACETAMINOPHEN 325 MG PO TABS: 650.0000 mg | ORAL_TABLET | ORAL | Status: DC | PRN

## 2020-01-14 MED ORDER — DICYCLOMINE HCL 10 MG PO CAPS
10.0000 mg | ORAL_CAPSULE | Freq: Three times a day (TID) | ORAL | Status: DC
  Administered 2020-01-15 – 2020-01-17 (×7): 10 mg via ORAL
  Filled 2020-01-14 (×7): qty 1

## 2020-01-14 MED ORDER — ZIPRASIDONE MESYLATE 20 MG IM SOLR: 20.0000 mg | Freq: Two times a day (BID) | INTRAMUSCULAR | Status: DC | PRN

## 2020-01-14 NOTE — ED Provider Notes (Signed)
[facility] EMERGENCY DEPARTMENT Provider Note   CSN: [id number] Arrival date & time: 01/14/20  1524     History Chief Complaint  Patient presents with  . Medical Clearance    Luis Warner is a 69 y.o. male.  HPI     69 year old male comes in a chief complaint of self injury.  Patient here with his wife.  Patient has no complaints from his side.  He is unsure how he ended up hurting himself.  He is also not sure the intent he might have had when he was holding knife.  According to patient's wife, she was at work.  Her son was going to pick up the patient and taken the lunch.  When the son called the patient, the patient informed him that he had hurt himself with a knife.  Allegedly 2 knives were used.  Patient has lesions to his neck area.  Per wife, nothing like this has transpired before.  She states that last month busipirone and Aricept were started by the neurologist.  The reason for starting these medication was because patient was having hallucinations.  Those hallucinations have improved.  She also states that patient is likely depressed.  He usually sleeps for a long time and often times it is very hard to motivate him to do anything.  Past Medical History:  Diagnosis Date  . Dementia ([place])   . Hypertension   . Lewy body dementia ([place]) 01/21/2019   Followed by specialist at [facility] Dr. Georgetta Haber    Patient Active Problem List   Diagnosis Date Noted  . Lewy body dementia ([place]) 01/21/2019  . Rectal bleeding 11/11/2018  . Nausea 07/27/2018  . Dementia, multiinfarct, with behavioral disturbance ([place]) 07/21/2017  . Anxiety disorder due to brain injury 07/21/2017  . 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 Sleep behavior disorder, REM 07/21/2017    Past Surgical History:  Procedure Laterality Date  . COLONOSCOPY N/A 11/15/2018   Procedure: COLONOSCOPY;  Surgeon: Rogene Houston, MD;  [facility];  Service: Endoscopy;  Laterality: N/A;  225pm  .  POLYPECTOMY  11/15/2018   Procedure: POLYPECTOMY;  Surgeon: Rogene Houston, MD;  [facility];  Service: Endoscopy;;  transverse colon  . skin grafts    . WRIST SURGERY         Family History  Family history unknown: Yes    Social History   Tobacco Use  . Smoking status: Current Every Day Smoker    Packs/day: 0.50  . Smokeless tobacco: Never Used  Vaping Use  . Vaping Use: Never used  Substance Use Topics  . Alcohol use: Not Currently    Alcohol/week: 0.0 standard drinks  . Drug use: Never    Home Medications Prior to Admission medications   Medication Sig Start Date End Date Taking? Authorizing Provider  albuterol (PROVENTIL HFA;VENTOLIN HFA) 108 (90 Base) MCG/ACT inhaler Inhale 2 puffs into the lungs every 6 (six) hours as needed for wheezing or shortness of breath. 04/21/18   Kathyrn Drown, MD  ALPRAZolam (XANAX) 0.25 MG tablet TAKE 1 TABLET(0.25 MG) BY MOUTH TWICE DAILY AS NEEDED FOR ANXIETY 11/02/19   Luking, Elayne Snare, MD  clonazePAM (KLONOPIN) 1 MG tablet Take 1 mg by mouth at bedtime.  02/28/14   [provider]  dicyclomine (BENTYL) 10 MG capsule 1 tablet 3 times daily as needed for abdominal pain 08/02/19   Kathyrn Drown, MD  memantine (NAMENDA) 10 MG tablet 10 mg 2 (  two) times daily.  05/12/19   [provider]  pantoprazole (PROTONIX) 40 MG tablet Take 1 tablet (40 mg total) by mouth daily. Patient not taking: Reported on 10/03/2019 06/21/19   Kathyrn Drown, MD  sertraline (ZOLOFT) 100 MG tablet Take 200 mg by mouth at bedtime.  02/27/14   [provider]  silodosin (RAPAFLO) 4 MG CAPS capsule Take 1 capsule (4 mg total) by mouth daily. Patient not taking: Reported on 11/02/2019 08/02/19   Kathyrn Drown, MD    Allergies    Penicillins  Review of Systems   Review of Systems  Constitutional: Positive for activity change.  Respiratory: Negative for shortness of breath.   Cardiovascular: Negative for chest pain.    Psychiatric/Behavioral: Positive for self-injury.    Physical Exam Updated Vital Signs BP (!) 188/82   Pulse (!) 56   Temp 98.7 F (37.1 C) (Oral)   Resp 16   Ht 5\' 9"  (1.753 m)   Wt 74.8 kg   SpO2 99%   BMI 24.37 kg/m   Physical Exam Vitals and nursing note reviewed.  Constitutional:      Appearance: He is well-developed.  HENT:     Head: Atraumatic.  Neck:     Comments: Multiple superficial abrasions to the right side of the neck Cardiovascular:     Rate and Rhythm: Normal rate.  Pulmonary:     Effort: Pulmonary effort is normal.  Musculoskeletal:     Cervical back: Neck supple.  Skin:    General: Skin is warm.  Neurological:     Mental Status: He is alert. Mental status is at baseline.     ED Results / Procedures / Treatments   Labs (all labs ordered are listed, but only abnormal results are displayed) Labs Reviewed  COMPREHENSIVE METABOLIC PANEL - Abnormal; Notable for the following components:      Result Value   Calcium 8.7 (*)    All other components within normal limits  SALICYLATE LEVEL - Abnormal; Notable for the following components:   Salicylate Lvl <4.4 (*)    All other components within normal limits  ACETAMINOPHEN LEVEL - Abnormal; Notable for the following components:   Acetaminophen (Tylenol), Serum <10 (*)    All other components within normal limits  CBC - Abnormal; Notable for the following components:   WBC 11.9 (*)    All other components within normal limits  RAPID URINE DRUG SCREEN, HOSP PERFORMED - Abnormal; Notable for the following components:   Benzodiazepines POSITIVE (*)    All other components within normal limits  RESP PANEL BY RT-PCR (FLU A&B, COVID) ARPGX2  ETHANOL    EKG None  Radiology No results found.  Procedures Procedures (including critical care time)  Medications Ordered in ED Medications  ziprasidone (GEODON) injection 20 mg (has no administration in time range)    And  LORazepam (ATIVAN) tablet 1  mg (has no administration in time range)  acetaminophen (TYLENOL) tablet 650 mg (has no administration in time range)  zolpidem (AMBIEN) tablet 5 mg (has no administration in time range)    ED Course  I have reviewed the triage vital signs and the nursing notes.  Pertinent labs & imaging results that were available during my care of the patient were reviewed by me and considered in my medical decision making (see chart for details).    MDM Rules/Calculators/A&P  69 year old comes in a chief complaint of self-harm.  He is up-to-date with his tetanus vaccination.  Patient has history of Lewy body dementia.  He had some behavioral changes and was started on Aricept and busipirone last month.  Patient might have some depression associated with his dementia.  He has never hurt himself in the past.  Right now patient is unable to give [place] the context surrounding the injury.  He is not even sure if he had intent to hurt him or he accidentally hurt himself.  Intent remains questionable, however patient does appear to have depression-like symptoms and is on new medications.  We will consult psych to see if they can assess the patient.  He is on multiple neuropsych medications, it is entirely possible that he is having poor interaction, of polypharmacy or side effects to the new medications  Final Clinical Impression(s) / ED Diagnoses Final diagnoses:  Non-suicidal self-harm  Dementia with behavioral disturbance, unspecified dementia type [facility])    Rx / DC Orders ED Discharge Orders    None       Varney Biles, MD 01/14/20 2032

## 2020-01-14 NOTE — ED Notes (Signed)
ED Provider at bedside. 

## 2020-01-14 NOTE — ED Notes (Signed)
Pt changed into wine scrubs. Personal belongings sent home with Pts significant other. Pts room secured at this time and wanded by security.

## 2020-01-14 NOTE — ED Triage Notes (Signed)
Pt has a history of dementia and tried to cut his throat with two different knifes. Patient used a Advertising copywriter and a serrated knife.  Pt has obvious superficial cuts to his rt, lt, and anterior neck.

## 2020-01-15 DIAGNOSIS — S069XAA Unspecified intracranial injury with loss of consciousness status unknown, initial encounter: Secondary | ICD-10-CM | POA: Clinically undetermined

## 2020-01-15 DIAGNOSIS — S069X9A Unspecified intracranial injury with loss of consciousness of unspecified duration, initial encounter: Secondary | ICD-10-CM | POA: Clinically undetermined

## 2020-01-15 NOTE — ED Provider Notes (Signed)
Luis Warner, TTS has evaluated patient, they recommend inpatient geriatric psychiatric admission.  They are working on placement.   [name], MD 01/15/20 [phone]

## 2020-01-15 NOTE — ED Notes (Addendum)
Pt pacing in room wondering what is going to happen next. Pt asking where his family members are. Pt requesting to call his wife but does not know her phone number. RN reassured Pt he is safe and can make a phone call in the morning around breakfast time. Pt redirected and reoriented to place and situation and Pt agreed to lay back in bed.

## 2020-01-15 NOTE — BH Assessment (Signed)
Pt's wife, Jhonnie Aliano [phone] called and requesting updates. States that she is patient's medial and financial POA. Clinician asked her to provide a copy for patient's chart. Nursing has already provided her with an update on patient's dispsoition for today ([facility]patient [facility] placement). His spouse is requesting a referral to [facility] and understands this facility can be sought out for placement. However, she was made aware that [place] placement will not be guaranted. Clinician explained the referral and placement process.  If any updates regarding his placement transpires today clinician has asked Disposition Social worker to follow up with his spouse.

## 2020-01-15 NOTE — ED Notes (Signed)
TTS Cart moved to Pts bedside. [facility] Counselor currently speaking with Pt.

## 2020-01-15 NOTE — Progress Notes (Signed)
Patient meets criteria for inpatient treatment per [facility], PA-C. No appropriate beds at [facility] currently. CSW faxed referrals to the following facilities for review:    [facility]    [facility]     TTS will continue to seek bed placement.     Ardelle Anton, MSW, LCSW Licensed Holiday representative - PRN (Transition of Care Team) [facility] [place]  01/15/2020 3:18 PM
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
## 2020-01-16 ENCOUNTER — Emergency Department (HOSPITAL_COMMUNITY): Payer: Medicare HMO

## 2020-01-16 LAB — URINALYSIS, ROUTINE W REFLEX MICROSCOPIC
Bilirubin Urine: NEGATIVE
Glucose, UA: NEGATIVE mg/dL
Hgb urine dipstick: NEGATIVE
Ketones, ur: NEGATIVE mg/dL
Nitrite: NEGATIVE
Protein, ur: NEGATIVE mg/dL
Specific Gravity, Urine: 1.021 (ref 1.005–1.030)
pH: 5 (ref 5.0–8.0)

## 2020-01-16 LAB — RESP PANEL BY RT-PCR (FLU A&B, COVID) ARPGX2
Influenza A by PCR: NEGATIVE
Influenza B by PCR: NEGATIVE
SARS Coronavirus 2 by RT PCR: NEGATIVE

## 2020-01-16 LAB — COMPREHENSIVE METABOLIC PANEL
ALT: 13 U/L (ref 0–44)
AST: 18 U/L (ref 15–41)
Albumin: 3.8 g/dL (ref 3.5–5.0)
Alkaline Phosphatase: 78 U/L (ref 38–126)
Anion gap: 8 (ref 5–15)
BUN: 18 mg/dL (ref 8–23)
CO2: 27 mmol/L (ref 22–32)
Calcium: 8.9 mg/dL (ref 8.9–10.3)
Chloride: 100 mmol/L (ref 98–111)
Creatinine, Ser: 1.07 mg/dL (ref 0.61–1.24)
GFR, Estimated: >60 mL/min (ref >60)
Glucose, Bld: 78 mg/dL (ref 70–99)
Potassium: 4.2 mmol/L (ref 3.5–5.1)
Sodium: 135 mmol/L (ref 135–145)
Total Bilirubin: 0.5 mg/dL (ref 0.3–1.2)
Total Protein: 6.6 g/dL (ref 6.5–8.1)

## 2020-01-16 NOTE — Progress Notes (Addendum)
CSW received phone call from [place] in admissions [phone]) at [facility]. They are requesting a Covid test, uranalysis, chest x-ray, and IVC paperwork if pt is under IVC. [facility] ED RN notified.     Audree Camel, MSW, LCSW, LCAS Clinical Social Worker II Disposition CSW [phone]   UPDATE: CSW has faxed all requested documents to [facility] except pt's urinalysis, which has not been completed. Caryl Pina at [facility] has received these documents and has requested copies of pt's HCPOA forms. Pt's wife Jamesen Stahnke [phone]) will fax the [facility] and POA forms to [facility] at [facility].

## 2020-01-16 NOTE — Progress Notes (Signed)
Pt accepted to [facility],   [place], [place], [place] [address].  Bed 411-A  Dr. Ananias Pilgrim is the attending provider.    Call report to [phone]   [facility] @ [facility] ED notified.     Pt is Voluntary.    Pt may be transported by [facility], [phone]   Pt scheduled  to arrive at [facility] after 9 am tomorrow (01/17/20)  [place] Disposition [phone] (cell)

## 2020-01-16 NOTE — ED Notes (Signed)
Pt has dry spot on bottom.  Mepiplex applied to sacral area per pts request.

## 2020-01-17 DIAGNOSIS — F322 Major depressive disorder, single episode, severe without psychotic features: Secondary | ICD-10-CM | POA: Insufficient documentation

## 2020-01-17 DIAGNOSIS — F03918 Unspecified dementia, unspecified severity, with other behavioral disturbance: Secondary | ICD-10-CM | POA: Insufficient documentation

## 2020-01-17 NOTE — Discharge Instructions (Addendum)
Transfer pt to [facility].  Dr. Ananias Pilgrim accepting

## 2020-01-17 NOTE — ED Notes (Signed)
pts wife called. Wife voiced concerns to this nurse. States she is very upset she did not receive a call when patient was being transferred to [facility]. This nurse expressed apologizes that she did not receive a phone call. It was not expressed to this nurse to call the wife before pt was being transferred. pts wife continued to express concerns about her not being called. Pt wife stated "I am not trying to fuss at you, I know yall girls are very busy up there and you do not have enough help". Once again this nurse expressed apologizes and offered her to speak to a nursing supervisor. She denied wanting to speak with nursing supervisor.Stated " I am going to be calling the president of your hospital". Once again this nurse apologized and the phone call was ended at that time.

## 2020-02-01 DIAGNOSIS — Z23 Encounter for immunization: Secondary | ICD-10-CM | POA: Diagnosis not present

## 2020-02-16 ENCOUNTER — Inpatient Hospital Stay: Payer: Medicare HMO | Admitting: Family Medicine

## 2020-02-29 ENCOUNTER — Other Ambulatory Visit: Payer: Self-pay

## 2020-03-05 ENCOUNTER — Telehealth (INDEPENDENT_AMBULATORY_CARE_PROVIDER_SITE_OTHER): Payer: Self-pay

## 2020-03-05 NOTE — Telephone Encounter (Signed)
Patient wife Loletha Carrow called today wanting an appointment with the office. She states patient has had some diarrhea on going for a while. They had to cancel his last appointment. Patient has dementia and she thinks this related to stress. Neurologist took patient of of the dicyclomine as ut was not really helping and can cause issues with dementia. He has an appointment with neurologist this Friday and one with Dr. Diona Foley on 03/12/2020. Patient wife wanted to have the patient's gallbladder checked also.

## 2020-03-05 NOTE — Telephone Encounter (Signed)
Patient wife aware per Mitzie that the appointment with Dr. Laural Golden is scheduled for 03/08/2020 at 10:30 am.

## 2020-03-05 NOTE — Telephone Encounter (Signed)
Per Dr. Laural Golden   Have the patient take Metamucil 4 grams or 1 Heaping Tablespoon daily at bedtime.  Can take imodium 2 mg one po QD  Have patient sit 15 minutes when he goes to the restroom  Keep a stool diary of frequency and consistency

## 2020-03-05 NOTE — Telephone Encounter (Signed)
Patient wife aware of all and aware that Dr. Laural Golden has some openings for 03/08/2020 and wife to be called with the date and time of appointment.

## 2020-03-08 ENCOUNTER — Ambulatory Visit (INDEPENDENT_AMBULATORY_CARE_PROVIDER_SITE_OTHER): Payer: Medicare HMO | Admitting: Internal Medicine

## 2020-03-08 ENCOUNTER — Other Ambulatory Visit: Payer: Self-pay

## 2020-03-08 ENCOUNTER — Encounter (INDEPENDENT_AMBULATORY_CARE_PROVIDER_SITE_OTHER): Payer: Self-pay | Admitting: Internal Medicine

## 2020-03-08 VITALS — BP 133/79 | HR 68 | Temp 98.2°F | Ht 69.0 in | Wt 165.0 lb

## 2020-03-08 DIAGNOSIS — R634 Abnormal weight loss: Secondary | ICD-10-CM

## 2020-03-08 DIAGNOSIS — G4752 REM sleep behavior disorder: Secondary | ICD-10-CM

## 2020-03-08 DIAGNOSIS — K529 Noninfective gastroenteritis and colitis, unspecified: Secondary | ICD-10-CM

## 2020-03-08 MED ORDER — LOPERAMIDE HCL 2 MG PO CAPS: 2.0000 mg | ORAL_CAPSULE | Freq: Two times a day (BID) | ORAL | Status: DC

## 2020-03-08 MED ORDER — METAMUCIL SMOOTH TEXTURE 58.6 % PO POWD: 1.0000 | Freq: Every day | ORAL | Status: DC

## 2020-03-08 MED ORDER — LOPERAMIDE HCL 2 MG PO CAPS: 2.0000 mg | ORAL_CAPSULE | Freq: Three times a day (TID) | ORAL | Status: DC

## 2020-03-08 NOTE — Patient Instructions (Signed)
Daily stool diary for one month. Physician will call with results of blood and stool tests.

## 2020-03-08 NOTE — Progress Notes (Signed)
Presenting complaint;  Diarrhea.  Database and subjective:  Patient is 70 year old Caucasian male who is here for scheduled visit accompanied by his wife Vickie. History is provided primarily by her.  Patient has had diarrhea for 1 year.  He has loose stools he also has urgency and he has had accidents just about every week.  He has had black stools but only when he has taken Pepto-Bismol.  He goes to the bathroom every 2-3 hours.  He has an average of 6 stools per day.  His wife wonders if his symptoms are somehow related to dementia.  He was diagnosed with Lewy body dementia 2 years ago and he is being followed at [facility]. Each time he goes he only passes small volume of stool.  But he remains with sensation that he has to go to the bathroom. He is to have 1 bowel movement daily before his diarrhea started. He may have occasional lower abdominal pain.   He has lost 15 pounds in 1 year. When his wife called her office last week we recommended taking Imodium 2 mg once or twice daily and Metamucil.  She has not seen any improvement yet. Family history is negative for celiac disease or IBD.  Current Medications: Outpatient Encounter Medications as of 03/08/2020  Medication Sig  . clonazePAM (KLONOPIN) 1 MG tablet Take 0.5 mg by mouth at bedtime.  . donepezil (ARICEPT) 10 MG tablet Take 10 mg by mouth daily.  . memantine (NAMENDA) 10 MG tablet 10 mg 2 (two) times daily.   . modafinil (PROVIGIL) 100 MG tablet Take 100 mg by mouth daily.  Marland Kitchen OVER THE COUNTER MEDICATION Vit D one Qd  . QUEtiapine (SEROQUEL) 50 MG tablet Take 50 mg by mouth at bedtime.  . sertraline (ZOLOFT) 100 MG tablet Take 100 mg by mouth in the morning and at bedtime.  . [DISCONTINUED] albuterol (PROVENTIL HFA;VENTOLIN HFA) 108 (90 Base) MCG/ACT inhaler Inhale 2 puffs into the lungs every 6 (six) hours as needed for wheezing or shortness of breath. (Patient not taking: Reported on 03/08/2020)  .  [DISCONTINUED] ALPRAZolam (XANAX) 0.25 MG tablet TAKE 1 TABLET(0.25 MG) BY MOUTH TWICE DAILY AS NEEDED FOR ANXIETY  . [DISCONTINUED] busPIRone (BUSPAR) 5 MG tablet Take 5 mg by mouth 3 (three) times daily.  . [DISCONTINUED] pantoprazole (PROTONIX) 40 MG tablet Take 1 tablet (40 mg total) by mouth daily. (Patient not taking: Reported on 10/03/2019)  . [DISCONTINUED] silodosin (RAPAFLO) 4 MG CAPS capsule Take 1 capsule (4 mg total) by mouth daily. (Patient not taking: Reported on 11/02/2019)   No facility-administered encounter medications on file as of 03/08/2020.   Past Medical History:  Diagnosis Date  . Dementia ([place])   . Hypertension   . Lewy body dementia ([place]) 01/21/2019   Followed by specialist at [facility] Dr. Georgetta Haber   Past Surgical History:  Procedure Laterality Date  . COLONOSCOPY N/A 11/15/2018   Procedure: COLONOSCOPY;  Surgeon: Rogene Houston, MD;  Location[facility];  Service: Endoscopy;  Laterality: N/A;  225pm  . POLYPECTOMY  11/15/2018   Procedure: POLYPECTOMY;  Surgeon: Rogene Houston, MD;  Location: [facility];  Service: Endoscopy;;  transverse colon  . skin grafts    . WRIST SURGERY      Objective: Blood pressure 133/79, pulse 68, temperature 98.2 F (36.8 C), temperature source Oral, height '5\' 9"'  (1.753 m), weight 165 lb (74.8 kg). Patient is alert and in no acute distress. Conjunctiva is pink.  Sclera is nonicteric Oropharyngeal mucosa is normal. No neck masses or thyromegaly noted. Cardiac exam with regular rhythm normal S1 and S2. No murmur or gallop noted. Lungs are clear to auscultation. Abdomen is symmetrical.  Bowel sounds are normal.  On palpation abdomen is soft.  He has mild hypogastric tenderness.  No organomegaly or masses. No LE edema or clubbing noted.  Labs/studies Results:  CBC Latest Ref Rng & Units 01/14/2020 06/12/2019 11/09/2018  WBC 4.0 - 10.5 K/uL 11.9(H) 8.0 -  Hemoglobin 13.0 - 17.0 g/dL 14.8 15.6 13.8  Hematocrit 39.0 - 52.0  % 43.8 47.5 -  Platelets 150 - 400 K/uL 200 244 -    CMP Latest Ref Rng & Units 01/16/2020 01/14/2020 06/12/2019  Glucose 70 - 99 mg/dL 78 92 100(H)  BUN 8 - 23 mg/dL '18 15 15  ' Creatinine 0.61 - 1.24 mg/dL 1.07 0.96 0.96  Sodium 135 - 145 mmol/L 135 135 135  Potassium 3.5 - 5.1 mmol/L 4.2 3.7 4.0  Chloride 98 - 111 mmol/L 100 103 101  CO2 22 - 32 mmol/L '27 23 24  ' Calcium 8.9 - 10.3 mg/dL 8.9 8.7(L) 8.9  Total Protein 6.5 - 8.1 g/dL 6.6 6.6 7.0  Total Bilirubin 0.3 - 1.2 mg/dL 0.5 0.8 0.7  Alkaline Phos 38 - 126 U/L 78 79 85  AST 15 - 41 U/L '18 18 19  ' ALT 0 - 44 U/L '13 14 15    ' Hepatic Function Latest Ref Rng & Units 01/16/2020 01/14/2020 06/12/2019  Total Protein 6.5 - 8.1 g/dL 6.6 6.6 7.0  Albumin 3.5 - 5.0 g/dL 3.8 3.9 4.2  AST 15 - 41 U/L '18 18 19  ' ALT 0 - 44 U/L '13 14 15  ' Alk Phosphatase 38 - 126 U/L 78 79 85  Total Bilirubin 0.3 - 1.2 mg/dL 0.5 0.8 0.7  Bilirubin, Direct 0.00 - 0.40 mg/dL - - -    Recent lab data noted.   Assessment:  #1.  Chronic diarrhea.  He has had diarrhea 1 year.  His last colonoscopy was in October 2020 for rectal bleeding revealing small tubular adenoma and sigmoid diverticulosis.  He did not have any colitis at that time.  Weight loss is worrisome.  It remains to be seen if he has malabsorption pancreatic insufficiency or autonomic dysfunction secondary to dementia.  If blood work and stool studies are negative would proceed with flexible sigmoidoscopy to rule out microscopic colitis. He may also have to be screened for celiac disease unless this testing has been done.  #2.  Weight loss.  According to his wife he has lost 15 pounds in 1 year.  He has good appetite but food intake appears to have decreased because of diarrhea with urgency and accidents.   Plan:  Patient will go to the lab for TSH B12 and folate levels GI pathogen panel and pancreatic fecal elastase. Loperamide 2 mg p.o. twice daily.  If he develops constipation he can drop dose back to  2 mg every morning. Metamucil 3 to 4 g by mouth daily at bedtime. Stool diary as to consistency and frequency of stools until next office visit in 1 month.

## 2020-03-12 LAB — B12 AND FOLATE PANEL
Folate: 5.9 ng/mL
Vitamin B-12: 316 pg/mL (ref 200–1100)

## 2020-03-12 LAB — TSH: TSH: 2.83 mIU/L (ref 0.40–4.50)

## 2020-04-17 ENCOUNTER — Ambulatory Visit (INDEPENDENT_AMBULATORY_CARE_PROVIDER_SITE_OTHER): Payer: Medicare HMO | Admitting: Internal Medicine

## 2020-04-17 ENCOUNTER — Other Ambulatory Visit: Payer: Self-pay

## 2020-04-17 ENCOUNTER — Encounter (INDEPENDENT_AMBULATORY_CARE_PROVIDER_SITE_OTHER): Payer: Self-pay | Admitting: Internal Medicine

## 2020-04-17 ENCOUNTER — Telehealth (INDEPENDENT_AMBULATORY_CARE_PROVIDER_SITE_OTHER): Payer: Self-pay

## 2020-04-17 VITALS — BP 133/79 | HR 69 | Temp 97.9°F | Ht 69.0 in | Wt 168.7 lb

## 2020-04-17 DIAGNOSIS — R634 Abnormal weight loss: Secondary | ICD-10-CM

## 2020-04-17 DIAGNOSIS — K529 Noninfective gastroenteritis and colitis, unspecified: Secondary | ICD-10-CM | POA: Diagnosis not present

## 2020-04-17 MED ORDER — LOPERAMIDE HCL 2 MG PO TABS: 1.0000 mg | ORAL_TABLET | Freq: Every day | ORAL | 0 refills | Status: DC

## 2020-04-17 NOTE — Patient Instructions (Signed)
Imodium 1 mg daily every other day. Keep stool diary as to frequency and consistency of stools until next office visit. Use glycerin or Dulcolax suppository on as-needed basis when he has an urge and unable to have a bowel movement. Try to have bowel movement after breakfast daily.

## 2020-04-17 NOTE — Telephone Encounter (Signed)
Per Dr. Laural Golden the patient had some quite large hemorrhoids at last colonoscopy in 2019. This was more than likely due to hemorrhoids. Patient wife aware of all.

## 2020-04-17 NOTE — Progress Notes (Signed)
Presenting complaint;  Follow-up for diarrhea and weight loss.  Database and subjective:  Patient is 70 year old Caucasian male who is here for scheduled visit accompanied by his wife Vickie.  He was last seen on 03/08/2020 for 1 year history of diarrhea he also reported weight loss of 15 pounds.  He weighed 165 pounds on his last visit. He was begun on loperamide 2 mg every morning followed by a second dose if necessary along with Metamucil 3 to 4 g daily at bedtime. TSH B12 and folate levels were checked and were normal. Also recommended GI pathogen panel and fecal pancreatic elastase.  Since his diarrhea resolved with Imodium and Metamucil his wife did not provide a stool sample to the lab. His wife did keep stool diary but she forgot it at home.  He is doing much better.  He is having average of 3 stools per day.  First bowel movement generally occurs after breakfast.  Most of his stools are soft.  He did have an accident last Saturday after he had evening meal.  He is using depends.  His appetite has improved.  He has gained 3 pounds since his last visit.  He generally eats 2 meals and multiple snacks per day.  Since he got better he has not been using Imodium on daily basis.  Current Medications: Outpatient Encounter Medications as of 04/17/2020  Medication Sig  . clonazePAM (KLONOPIN) 1 MG tablet Take 0.5 mg by mouth at bedtime.  . donepezil (ARICEPT) 10 MG tablet Take 10 mg by mouth daily.  . fluticasone (CUTIVATE) 0.05 % cream Apply topically 2 (two) times daily as needed.  . loperamide (IMODIUM) 2 MG capsule Take 1 capsule (2 mg total) by mouth 2 (two) times daily.  . memantine (NAMENDA) 10 MG tablet 10 mg 2 (two) times daily.   . modafinil (PROVIGIL) 100 MG tablet Take 100 mg by mouth daily.  Marland Kitchen OVER THE COUNTER MEDICATION Vit D one Qd  . psyllium (METAMUCIL SMOOTH TEXTURE) 58.6 % powder Take 1 packet by mouth at bedtime.  Marland Kitchen QUEtiapine (SEROQUEL) 50 MG tablet Take 50 mg by mouth at  bedtime.  . sertraline (ZOLOFT) 100 MG tablet Take 100 mg by mouth in the morning and at bedtime.   No facility-administered encounter medications on file as of 04/17/2020.     Objective: Blood pressure 133/79, pulse 69, temperature 97.9 F (36.6 C), temperature source Oral, height '5\' 9"'  (1.753 m), weight 168 lb 11.2 oz (76.5 kg). Patient is alert and in no acute distress. He is wearing a mask. Conjunctiva is pink. Sclera is nonicteric Oropharyngeal mucosa is normal. No neck masses or thyromegaly noted. Cardiac exam with regular rhythm normal S1 and S2. No murmur or gallop noted. Lungs are clear to auscultation. Abdomen is symmetrical.  Bowel sounds are normal.  Abdomen remains soft without tenderness organomegaly or masses. No LE edema or clubbing noted.  Labs/studies Results:  CBC Latest Ref Rng & Units 01/14/2020 06/12/2019 11/09/2018  WBC 4.0 - 10.5 K/uL 11.9(H) 8.0 -  Hemoglobin 13.0 - 17.0 g/dL 14.8 15.6 13.8  Hematocrit 39.0 - 52.0 % 43.8 47.5 -  Platelets 150 - 400 K/uL 200 244 -    CMP Latest Ref Rng & Units 01/16/2020 01/14/2020 06/12/2019  Glucose 70 - 99 mg/dL 78 92 100(H)  BUN 8 - 23 mg/dL '18 15 15  ' Creatinine 0.61 - 1.24 mg/dL 1.07 0.96 0.96  Sodium 135 - 145 mmol/L 135 135 135  Potassium 3.5 - 5.1 mmol/L  4.2 3.7 4.0  Chloride 98 - 111 mmol/L 100 103 101  CO2 22 - 32 mmol/L '27 23 24  ' Calcium 8.9 - 10.3 mg/dL 8.9 8.7(L) 8.9  Total Protein 6.5 - 8.1 g/dL 6.6 6.6 7.0  Total Bilirubin 0.3 - 1.2 mg/dL 0.5 0.8 0.7  Alkaline Phos 38 - 126 U/L 78 79 85  AST 15 - 41 U/L '18 18 19  ' ALT 0 - 44 U/L '13 14 15    ' Hepatic Function Latest Ref Rng & Units 01/16/2020 01/14/2020 06/12/2019  Total Protein 6.5 - 8.1 g/dL 6.6 6.6 7.0  Albumin 3.5 - 5.0 g/dL 3.8 3.9 4.2  AST 15 - 41 U/L '18 18 19  ' ALT 0 - 44 U/L '13 14 15  ' Alk Phosphatase 38 - 126 U/L 78 79 85  Total Bilirubin 0.3 - 1.2 mg/dL 0.5 0.8 0.7  Bilirubin, Direct 0.00 - 0.40 mg/dL - - -    Other lab data from 03/12/2020 TSH  2.83 Serum B12 316 and folate 5.9  Assessment:  #1.  Chronic diarrhea.  Significant symptomatic improvement with low-dose loperamide and Benefiber.  Therefore his wife decided to hold off stool studies.  He is not losing weight anymore.  Therefore malabsorption less likely.  He is still having sporadic episodes of fecal incontinence which is most likely related to underlying dementia.  Will continue symptomatic therapy and hold off further evaluation  #2.  Weight loss.  Patient's appetite has improved and he has gained 3 pounds.  Plan:  Patient advised to go back on Imodium and take 1 mg every other day. He will use Dulcolax or glycerin suppository on as-needed basis or if he has an urge and unable to have a bowel movement. Furthermore he will try to have a bowel movement after breakfast daily. He will return for office visit in 3 months.

## 2020-04-17 NOTE — Telephone Encounter (Signed)
Patient wife called back and states they went for breakfast this am and after Luis Warner ate he went to the restroom and had bright red blood in the toilet, along with stool which was not formed,but mushy. She wanted to know if she should be concerned or if this could have been a hemorrhoid. She states when he wiped there was blood on the toilet paper and in the commode.

## 2020-04-19 ENCOUNTER — Ambulatory Visit (INDEPENDENT_AMBULATORY_CARE_PROVIDER_SITE_OTHER): Payer: Medicare HMO | Admitting: Internal Medicine

## 2020-05-01 ENCOUNTER — Ambulatory Visit: Payer: Medicare HMO | Admitting: Family Medicine

## 2020-05-09 ENCOUNTER — Telehealth (INDEPENDENT_AMBULATORY_CARE_PROVIDER_SITE_OTHER): Payer: Self-pay

## 2020-05-09 NOTE — Telephone Encounter (Signed)
If rectal bleeding persists, may need to be seen in the ER.  Please make aware Dr. Laural Golden about this, as he is his patient. Thanks

## 2020-05-09 NOTE — Telephone Encounter (Signed)
Patient's wife called states patient has had several sludgy stools that have bright red blood in them.   Patient had this recently and was told by Dr.Rehman probably due to the large hemorrhoids he has. Per wife patient has dementia and has an obsesion of thinking he needs to have a bm, so he goes to the bathroom at times and is unable to produce any stool other times she describes the stools being loose or sludgy. No other complaints, just three stools seen with bright red blood. Please advise.

## 2020-05-09 NOTE — Telephone Encounter (Signed)
Ann per Dr. Laural Golden please make a referral to Dr. Marcello Moores the colorectal surgeon for colorectal bleeds.  Per Dr. Laural Golden he agrees,but wants patient referred to colorectal surgeon to discuss. Patient wife is aware of all and will expect a call from their office.

## 2020-05-10 NOTE — Telephone Encounter (Signed)
Referral faxed to [facility], they will contact patient with apt

## 2020-07-06 IMAGING — CR DG CHEST 2V
2 series · 2 of 2 positions shown · non-contrast
Comparison: July 16, 2017

CLINICAL DATA: Cough for 3 weeks with chest pain and shortness of
breath

EXAM:
CHEST - 2 VIEW

[w chest pa]
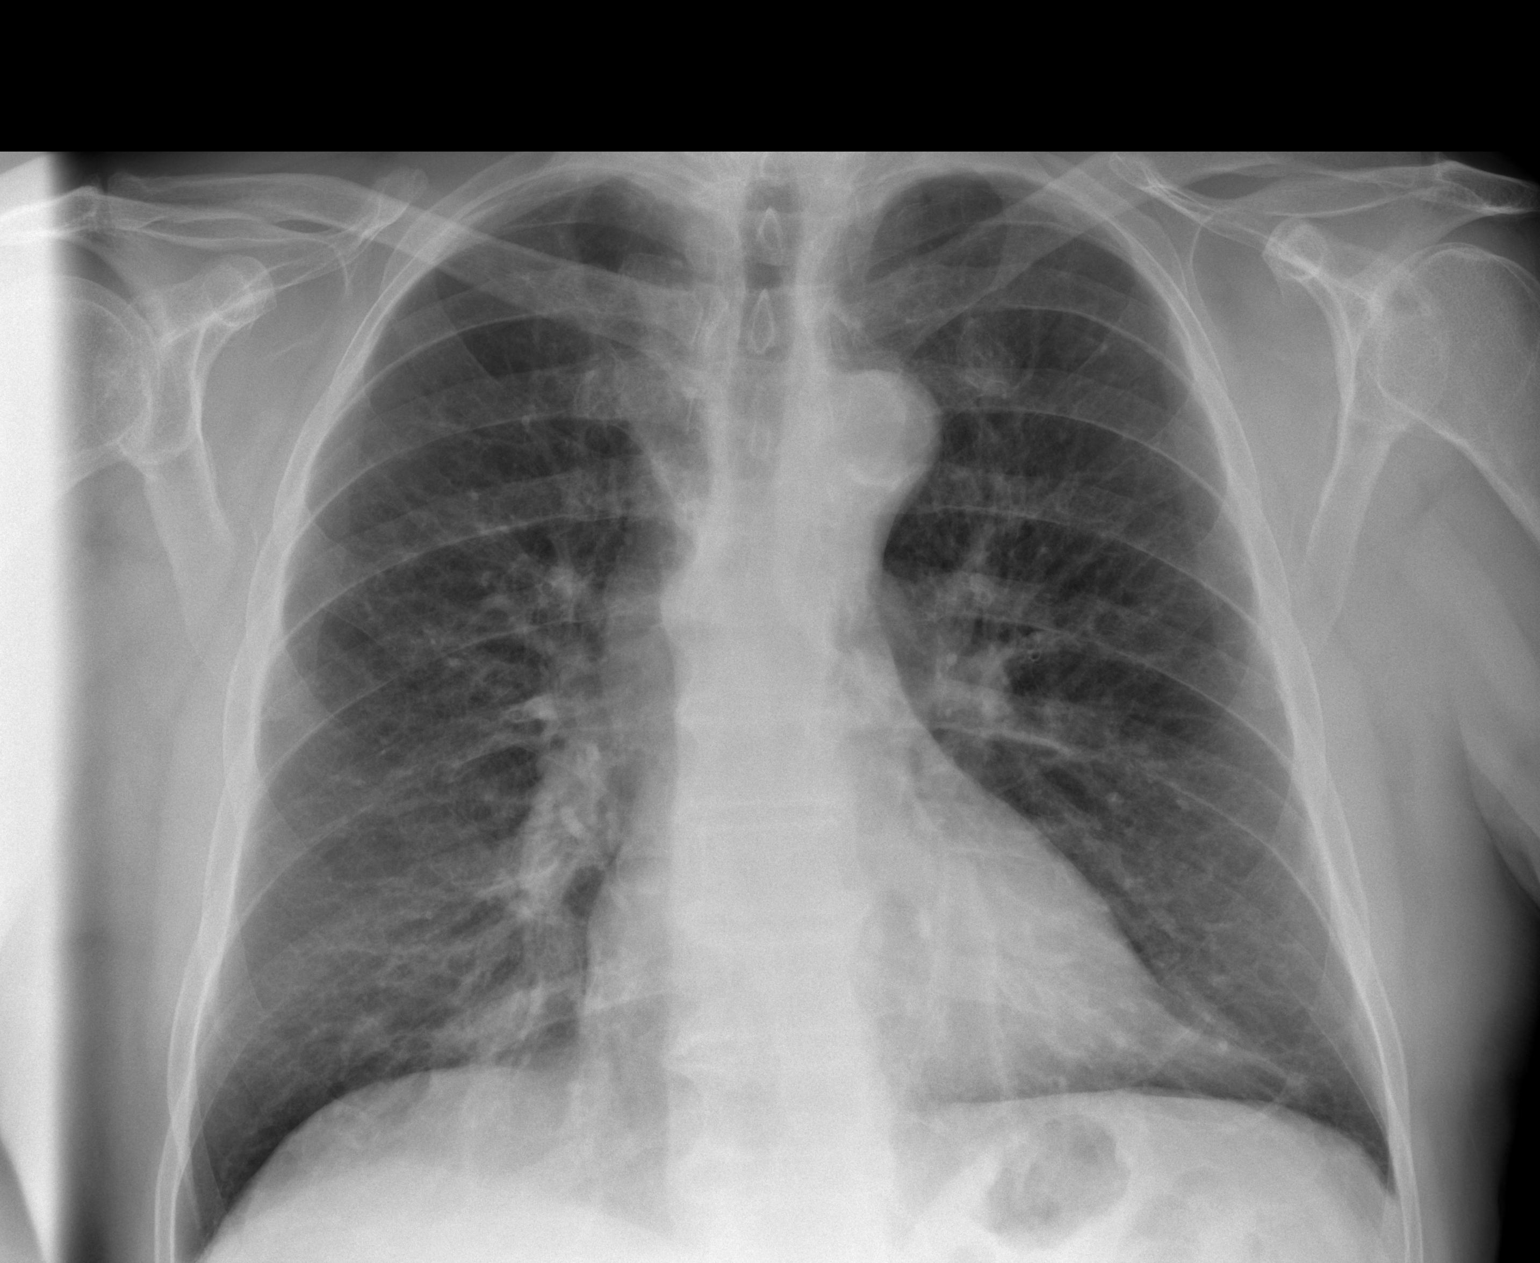

[w chest lat]
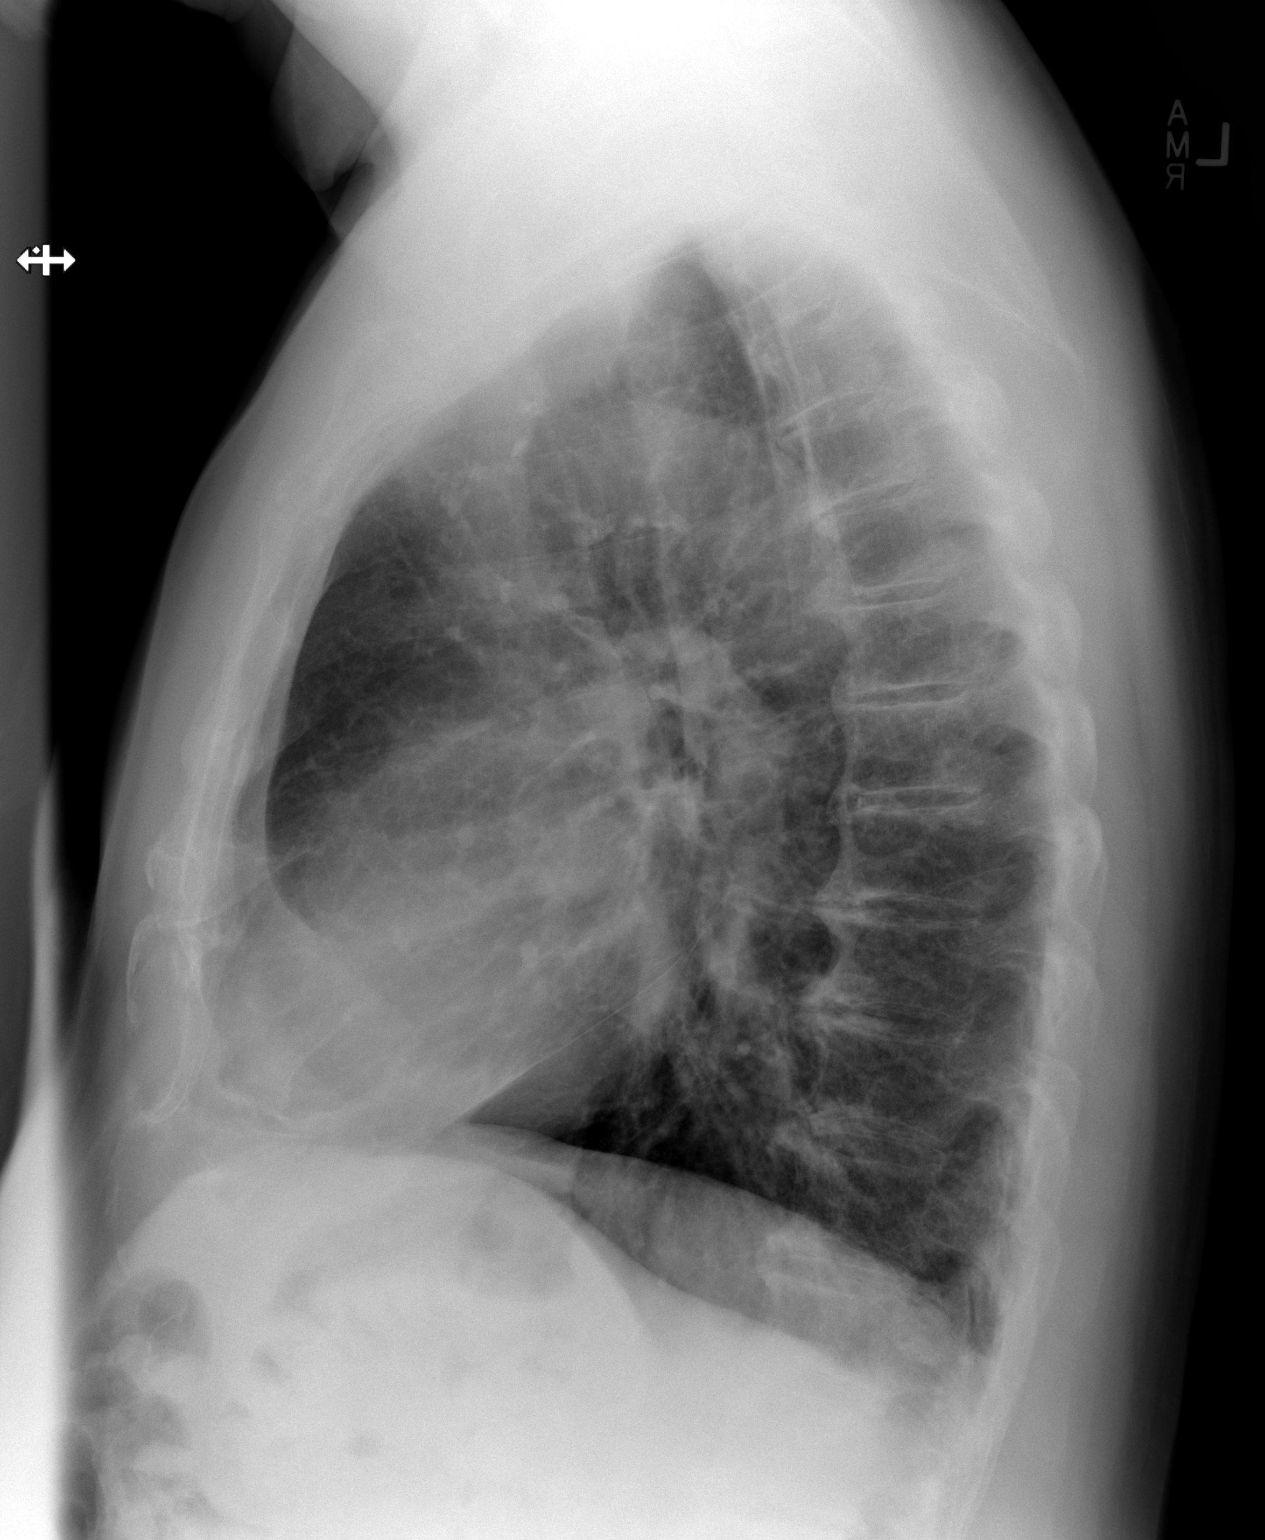

[2 of 2 positions shown; findings below may reference images not displayed]

FINDINGS: There is no evident edema or consolidation. The heart size and
pulmonary vascularity are normal. No adenopathy. There is aortic
atherosclerosis. There is degenerative change in the thoracic spine.
IMPRESSION: No edema or consolidation.  There is aortic atherosclerosis.

Aortic Atherosclerosis ([id number]).

## 2020-08-07 ENCOUNTER — Ambulatory Visit (INDEPENDENT_AMBULATORY_CARE_PROVIDER_SITE_OTHER): Payer: Medicare HMO | Admitting: Internal Medicine

## 2020-12-25 ENCOUNTER — Ambulatory Visit: Payer: Self-pay

## 2021-01-08 ENCOUNTER — Emergency Department (HOSPITAL_COMMUNITY): Payer: Medicare HMO

## 2021-01-08 ENCOUNTER — Emergency Department (HOSPITAL_COMMUNITY)
Admission: EM | Admit: 2021-01-08 | Discharge: 2021-01-08 | Disposition: A | Discharge status: Home / Self Care | Payer: Medicare HMO | Attending: Emergency Medicine | Admitting: Emergency Medicine

## 2021-01-08 ENCOUNTER — Encounter (HOSPITAL_COMMUNITY): Payer: Self-pay | Admitting: *Deleted

## 2021-01-08 DIAGNOSIS — F17210 Nicotine dependence, cigarettes, uncomplicated: Secondary | ICD-10-CM | POA: Insufficient documentation

## 2021-01-08 DIAGNOSIS — J40 Bronchitis, not specified as acute or chronic: Secondary | ICD-10-CM

## 2021-01-08 DIAGNOSIS — F03918 Unspecified dementia, unspecified severity, with other behavioral disturbance: Secondary | ICD-10-CM | POA: Insufficient documentation

## 2021-01-08 DIAGNOSIS — I10 Essential (primary) hypertension: Secondary | ICD-10-CM | POA: Diagnosis not present

## 2021-01-08 DIAGNOSIS — Z20822 Contact with and (suspected) exposure to covid-19: Secondary | ICD-10-CM | POA: Diagnosis not present

## 2021-01-08 DIAGNOSIS — R059 Cough, unspecified: Secondary | ICD-10-CM | POA: Diagnosis present

## 2021-01-08 LAB — COMPREHENSIVE METABOLIC PANEL
ALT: 13 U/L (ref 0–44)
AST: 16 U/L (ref 15–41)
Albumin: 3.8 g/dL (ref 3.5–5.0)
Alkaline Phosphatase: 85 U/L (ref 38–126)
Anion gap: 7 (ref 5–15)
BUN: 17 mg/dL (ref 8–23)
CO2: 26 mmol/L (ref 22–32)
Calcium: 8.8 mg/dL — ABNORMAL LOW (ref 8.9–10.3)
Chloride: 104 mmol/L (ref 98–111)
Creatinine, Ser: 1.14 mg/dL (ref 0.61–1.24)
GFR, Estimated: >60 mL/min (ref >60)
Glucose, Bld: 95 mg/dL (ref 70–99)
Potassium: 4.2 mmol/L (ref 3.5–5.1)
Sodium: 137 mmol/L (ref 135–145)
Total Bilirubin: 0.6 mg/dL (ref 0.3–1.2)
Total Protein: 6.9 g/dL (ref 6.5–8.1)

## 2021-01-08 LAB — CBC WITH DIFFERENTIAL/PLATELET
Abs Immature Granulocytes: 0.04 10*3/uL (ref 0.00–0.07)
Basophils Absolute: 0.1 10*3/uL (ref 0.0–0.1)
Basophils Relative: 1 %
Eosinophils Absolute: 0.1 10*3/uL (ref 0.0–0.5)
Eosinophils Relative: 1 %
HCT: 42.6 % (ref 39.0–52.0)
Hemoglobin: 13.9 g/dL (ref 13.0–17.0)
Immature Granulocytes: 0 %
Lymphocytes Relative: 17 %
Lymphs Abs: 1.8 10*3/uL (ref 0.7–4.0)
MCH: 29.7 pg (ref 26.0–34.0)
MCHC: 32.6 g/dL (ref 30.0–36.0)
MCV: 91 fL (ref 80.0–100.0)
Monocytes Absolute: 0.7 10*3/uL (ref 0.1–1.0)
Monocytes Relative: 7 %
Neutro Abs: 7.4 10*3/uL (ref 1.7–7.7)
Neutrophils Relative %: 74 %
Platelets: 212 10*3/uL (ref 150–400)
RBC: 4.68 MIL/uL (ref 4.22–5.81)
RDW: 13.7 % (ref 11.5–15.5)
WBC: 10 10*3/uL (ref 4.0–10.5)
nRBC: 0 % (ref 0.0–0.2)

## 2021-01-08 LAB — RESP PANEL BY RT-PCR (FLU A&B, COVID) ARPGX2
Influenza A by PCR: NEGATIVE
Influenza B by PCR: NEGATIVE
SARS Coronavirus 2 by RT PCR: NEGATIVE

## 2021-01-08 MED ORDER — DOXYCYCLINE HYCLATE 100 MG PO CAPS: 100.0000 mg | ORAL_CAPSULE | Freq: Two times a day (BID) | ORAL | 0 refills | Status: DC
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
## 2021-01-08 NOTE — ED Triage Notes (Signed)
Patient previously diagnosed with pneumonia. Referred here for antibiotics

## 2021-01-08 NOTE — Discharge Instructions (Signed)
Follow-up with the [facility] pulmonary in 1 to 2 weeks for your cough

## 2021-01-08 NOTE — ED Notes (Signed)
Pt placed on cardiac monitor with BP to set cycle every 30 minutes. Continuous pulse oximeter applied.  

## 2021-02-14 ENCOUNTER — Institutional Professional Consult (permissible substitution): Payer: Medicare HMO | Admitting: Internal Medicine

## 2021-02-14 NOTE — Progress Notes (Deleted)
° °  Luis Warner, male    DOB: 17-Dec-1950, 71 y.o.   MRN: [id number]   Brief patient profile:  referred to pulmonary clinic 02/14/2021 by *** for ***        History of Present Illness  02/14/2021  Pulmonary/ 1st office eval/Kloee Ballew  No chief complaint on file.    Dyspnea:  *** Cough: *** Sleep: *** SABA use:   Past Medical History:  Diagnosis Date   Dementia ([place])    Hypertension    Lewy body dementia ([place]) 01/21/2019   Followed by specialist at [facility] Dr. Georgetta Haber    Outpatient Medications Prior to Visit  Medication Sig Dispense Refill   clonazePAM (KLONOPIN) 1 MG tablet Take 0.5 mg by mouth at bedtime.  1   donepezil (ARICEPT) 10 MG tablet Take 10 mg by mouth daily.     doxycycline (VIBRAMYCIN) 100 MG capsule Take 1 capsule (100 mg total) by mouth 2 (two) times daily. One po bid x 7 days 14 capsule 0   fluticasone (CUTIVATE) 0.05 % cream Apply topically 2 (two) times daily as needed.     loperamide (IMODIUM A-D) 2 MG tablet Take 0.5 tablets (1 mg total) by mouth daily with breakfast.  0   memantine (NAMENDA) 10 MG tablet 10 mg 2 (two) times daily.      modafinil (PROVIGIL) 100 MG tablet Take 100 mg by mouth daily.     OVER THE COUNTER MEDICATION Vit D one Qd     psyllium (METAMUCIL SMOOTH TEXTURE) 58.6 % powder Take 1 packet by mouth at bedtime.     QUEtiapine (SEROQUEL) 50 MG tablet Take 50 mg by mouth at bedtime.     sertraline (ZOLOFT) 100 MG tablet Take 100 mg by mouth in the morning and at bedtime.  0   No facility-administered medications prior to visit.     Objective:     There were no vitals taken for this visit.         Assessment   No problem-specific Assessment & Plan notes found for this encounter.     Christinia Gully, MD 02/14/2021

## 2021-03-07 ENCOUNTER — Other Ambulatory Visit: Payer: Self-pay

## 2021-03-07 ENCOUNTER — Ambulatory Visit (HOSPITAL_COMMUNITY)
Admission: RE | Admit: 2021-03-07 | Discharge: 2021-03-07 | Disposition: A | Discharge status: Home / Self Care | Payer: Medicare HMO | Source: Ambulatory Visit | Attending: Nurse Practitioner | Admitting: Nurse Practitioner

## 2021-03-07 ENCOUNTER — Encounter: Payer: Self-pay | Admitting: Nurse Practitioner

## 2021-03-07 ENCOUNTER — Ambulatory Visit (INDEPENDENT_AMBULATORY_CARE_PROVIDER_SITE_OTHER): Payer: Medicare HMO | Admitting: Nurse Practitioner

## 2021-03-07 VITALS — BP 162/95 | HR 74 | Temp 97.3°F | Wt 191.0 lb

## 2021-03-07 DIAGNOSIS — R051 Acute cough: Secondary | ICD-10-CM | POA: Diagnosis present

## 2021-03-07 DIAGNOSIS — R0989 Other specified symptoms and signs involving the circulatory and respiratory systems: Secondary | ICD-10-CM | POA: Insufficient documentation

## 2021-03-07 MED ORDER — LEVALBUTEROL HCL 0.63 MG/3ML IN NEBU
0.6300 mg | INHALATION_SOLUTION | RESPIRATORY_TRACT | 12 refills | Status: DC | PRN
Start: 2021-03-07 — End: 2022-05-15

## 2021-03-07 MED ORDER — DOXYCYCLINE HYCLATE 100 MG PO TABS: 100.0000 mg | ORAL_TABLET | Freq: Two times a day (BID) | ORAL | 0 refills | Status: AC

## 2021-03-07 NOTE — Progress Notes (Signed)
Subjective:    Patient ID: Luis Warner, male    DOB: 11-29-1950, 71 y.o.   MRN: [id number]  HPI  Patient comes to the clinic today with his son for cough, chest congestion, wheezing, and shortness of breath with exertion x1 week.  Patient denies fevers chills body aches difficulty breathing.    Patient has history of Lewy body dementia. Patient was diagnosed with pneumonia in December and was given Levaquin, albuterol, and prednisone.  Patient has finished Levaquin and was starting to feel better until last week.  Patient has used albuterol for symptoms however his wife states that albuterol makes him more confused and they have stopped using albuterol (stopped yesterday).  Son also states that patient gets confused with steroid use.  Neurology has recommended that patient not use any steroids at this time. Son states that patient eats well does not notice any coughing or have any concerns for aspiration pneumonia.    Has appointment with pulmonology next month.     Review of Systems  Respiratory:  Positive for cough and wheezing.        SOB with exertion  All other systems reviewed and are negative.     Objective:   Physical Exam Constitutional:      General: He is not in acute distress.    Appearance: Normal appearance. He is normal weight. He is not ill-appearing or toxic-appearing.  HENT:     Head: Normocephalic and atraumatic.     Right Ear: Tympanic membrane, ear canal and external ear normal. There is no impacted cerumen.     Left Ear: Tympanic membrane, ear canal and external ear normal. There is no impacted cerumen.     Nose: Nose normal. No congestion or rhinorrhea.     Mouth/Throat:     Mouth: Mucous membranes are moist.     Pharynx: No oropharyngeal exudate or posterior oropharyngeal erythema.  Cardiovascular:     Rate and Rhythm: Normal rate and regular rhythm.     Pulses: Normal pulses.     Heart sounds: Normal heart sounds. No murmur heard. Pulmonary:      Effort: Pulmonary effort is normal. No respiratory distress.     Breath sounds: No stridor. Examination of the right-middle field reveals rales. Examination of the right-lower field reveals rales. Rales present. No wheezing or rhonchi.  Chest:     Chest wall: No tenderness.  Musculoskeletal:        General: Normal range of motion.     Cervical back: Normal range of motion and neck supple. No rigidity or tenderness.  Lymphadenopathy:     Cervical: No cervical adenopathy.  Skin:    General: Skin is warm.     Capillary Refill: Capillary refill takes less than 2 seconds.  Neurological:     Mental Status: He is alert. Mental status is at baseline. He is disoriented.  Psychiatric:        Mood and Affect: Mood normal.        Behavior: Behavior normal.          Assessment & Plan:    1. Lung crackles -Possible pneumonia versus COPD -Chest x-ray was negative but will cover with antibiotics due to clinical findings - DG Chest 2 View -Stop albuterol.  Try Xopenex.  Hoping that Xopenex will cause less agitation and confusion - levalbuterol (XOPENEX) 0.63 MG/3ML nebulizer solution; Take 3 mLs (0.63 mg total) by nebulization every 4 (four) hours as needed for wheezing or shortness of breath.  Dispense: 3 mL; Refill: 12 - doxycycline (VIBRA-TABS) 100 MG tablet; Take 1 tablet (100 mg total) by mouth 2 (two) times daily for 7 days.  Dispense: 14 tablet; Refill: 0 - COVID-19, Flu A+B and RSV -Return to clinic if patient has behavioral changes,  Develops fever, or if symptoms persist or worsen. - Follow-up with pulmonology as scheduled - Follow up in 1 week

## 2021-03-08 ENCOUNTER — Telehealth: Payer: Self-pay

## 2021-03-08 NOTE — Telephone Encounter (Addendum)
-----   Message from Claire Shown, NP sent at 03/07/2021  4:57 PM EST ----- Nurses, Please call wife of patient and have patient schedule appointment to see Dr. Nicki Reaper next week. Thanks.  Appt scheduled

## 2021-03-09 LAB — COVID-19, FLU A+B AND RSV
Influenza A, NAA: NOT DETECTED
Influenza B, NAA: NOT DETECTED
RSV, NAA: NOT DETECTED
SARS-CoV-2, NAA: NOT DETECTED

## 2021-03-09 NOTE — Progress Notes (Signed)
COVID, Flu, and RSV negative. We will see you all soon in the clinic. I hope [name] is starting to feel better.   Barbee Shropshire

## 2021-03-13 ENCOUNTER — Other Ambulatory Visit: Payer: Self-pay

## 2021-03-13 ENCOUNTER — Ambulatory Visit (INDEPENDENT_AMBULATORY_CARE_PROVIDER_SITE_OTHER): Payer: Medicare HMO | Admitting: Family Medicine

## 2021-03-13 ENCOUNTER — Encounter: Payer: Self-pay | Admitting: Family Medicine

## 2021-03-13 VITALS — Temp 96.4°F | Wt 188.4 lb

## 2021-03-13 DIAGNOSIS — R051 Acute cough: Secondary | ICD-10-CM

## 2021-03-13 DIAGNOSIS — R0609 Other forms of dyspnea: Secondary | ICD-10-CM

## 2021-03-13 NOTE — Progress Notes (Signed)
° °  Subjective:    Patient ID: Luis Warner, male    DOB: 11/04/1950, 71 y.o.   MRN: [id number]  HPI Pt here to follow up on cough and congestion. Pt seen by Barbee Shropshire 03/08/21. Pt still having some cough but is better. Also having gurgles but coughing seems to help. Nebulizer treatments are helping.   Chest x-ray shared with patient and his son Patient states its been going on for a good 10 to 13 days States he did cough up some blood I talked to the son about this he states he does not know of this and is not aware of this He was able to text his mother who stated that no hemoptysis but has had some nosebleeds No fevers recently Seemingly the cough is starting to get better  Review of Systems     Objective:   Physical Exam  Previous note reviewed X-ray does not reveal any pneumonia Lungs are clear no crackles Heart regular pulse normal extremities no edema      Assessment & Plan:  Recent bronchitis and possible pneumonia treated with antibiotics improved no need for further antibiotics finish out what he has currently  Would be advisable to see pulmonary given his smoking history potentially CT scan of chest Patient does have moderate dementia but they are working with this as best as possible Patient may need pulmonary function testing and inhalers because he does get out of breath with activity Very sedentary because of his dementia

## 2021-03-28 ENCOUNTER — Ambulatory Visit: Payer: Medicare HMO | Admitting: Internal Medicine

## 2021-03-28 ENCOUNTER — Encounter: Payer: Self-pay | Admitting: Internal Medicine

## 2021-03-28 ENCOUNTER — Other Ambulatory Visit: Payer: Self-pay

## 2021-03-28 DIAGNOSIS — R053 Chronic cough: Secondary | ICD-10-CM | POA: Insufficient documentation

## 2021-03-28 DIAGNOSIS — R0609 Other forms of dyspnea: Secondary | ICD-10-CM

## 2021-03-28 MED ORDER — CEFDINIR 300 MG PO CAPS: 300.0000 mg | ORAL_CAPSULE | Freq: Two times a day (BID) | ORAL | 0 refills | Status: DC

## 2021-03-28 MED ORDER — FAMOTIDINE 20 MG PO TABS: ORAL_TABLET | ORAL | 11 refills | Status: DC

## 2021-03-28 MED ORDER — PANTOPRAZOLE SODIUM 40 MG PO TBEC: 40.0000 mg | DELAYED_RELEASE_TABLET | Freq: Every day | ORAL | 2 refills | Status: DC

## 2021-03-28 NOTE — Progress Notes (Signed)
Luis Warner, male    DOB: 07-13-1950    MRN: [id number]   Brief patient profile:  59   yowm  active smoker with Lewy body Dementia self- referred to pulmonary clinic in [place]  03/28/2021 for ? Pneumonia.   Baseline = walked all over neighborhood s problems  s doe until around winter/ spring of 2022 then sob / cough then a few days before Tgiving 2022 wife noted noisy breathing / gurgling no worse p meals rx levaquin ceph and rocephin  x 3 days with a normal cxr 01/08/21  and did improve but still gurgling so returned on 03/07/21 with crackles on exam > repeated cxr nl.     History of Present Illness  03/28/2021  Pulmonary/ 1st office eval/ [facility] / [place] [facility]  Chief Complaint  Patient presents with   Consult    Self referral for Bronchitis. "Gurgling sounds" and wheezing   Dyspnea:  mailbox and back x 50 ft steep up to MB  Cough: rattling worse after supper / elevates head  and usually feels fine   in am  and not producing excess or purulent sputum Sleep: flat bed / 2 pillows on side  SABA use: not sure it's helping  No obvious day to day or daytime variability or assoc excess/ purulent sputum or mucus plugs or hemoptysis or cp or chest tightness, subjective wheeze or overt sinus or hb symptoms.   Sleeping as above without nocturnal  or early am exacerbation  of respiratory  c/o's or need for noct saba. Also denies any obvious fluctuation of symptoms with weather or environmental changes or other aggravating or alleviating factors except as outlined above   No unusual exposure hx or h/o childhood pna/ asthma or knowledge of premature birth.  Current Allergies, Complete Past Medical History, Past Surgical History, Family History, and Social History were reviewed in [employer] record.  ROS  The following are not active complaints unless bolded Hoarseness, sore throat, dysphagia, dental problems, itching, sneezing,  nasal congestion or discharge of  excess mucus or purulent secretions, ear ache,   fever, chills, sweats, unintended wt loss or wt gain, classically pleuritic or exertional cp,  orthopnea pnd or arm/hand swelling  or leg swelling, presyncope, palpitations, abdominal pain, anorexia, nausea, vomiting, diarrhea  or change in bowel habits or change in bladder habits, change in stools or change in urine, dysuria, hematuria,  rash, arthralgias, visual complaints, headache, numbness, weakness or ataxia or problems with walking or coordination,  change in mood or  memory.           Past Medical History:  Diagnosis Date   Dementia ([place])    Hypertension    Lewy body dementia ([place]) 01/21/2019   Followed by specialist at [facility] Dr. Georgetta Haber    Outpatient Medications Prior to Visit  Medication Sig Dispense Refill   Cholecalciferol 25 MCG (1000 UT) tablet Take by mouth.     clonazePAM (KLONOPIN) 0.5 MG tablet Take 0.5 mg by mouth 2 (two) times daily as needed.     donepezil (ARICEPT) 10 MG tablet Take 10 mg by mouth daily.     levalbuterol (XOPENEX) 0.63 MG/3ML nebulizer solution Take 3 mLs (0.63 mg total) by nebulization every 4 (four) hours as needed for wheezing or shortness of breath. 3 mL 12   memantine (NAMENDA) 10 MG tablet 10 mg 2 (two) times daily.      modafinil (PROVIGIL) 100 MG tablet Take 100 mg by mouth  daily.     QUEtiapine (SEROQUEL) 50 MG tablet Take 50 mg by mouth at bedtime.     sertraline (ZOLOFT) 100 MG tablet Take 100 mg by mouth in the morning and at bedtime.  0   sertraline (ZOLOFT) 25 MG tablet Take 25 mg by mouth 2 (two) times daily.     clonazePAM (KLONOPIN) 1 MG tablet Take 0.5 mg by mouth at bedtime.  1   dicyclomine (BENTYL) 10 MG capsule Take by mouth.     fluticasone (CUTIVATE) 0.05 % cream Apply topically 2 (two) times daily as needed.     loperamide (IMODIUM A-D) 2 MG tablet Take 0.5 tablets (1 mg total) by mouth daily with breakfast.  0   memantine (NAMENDA) 10 MG tablet Take by mouth.     OVER  THE COUNTER MEDICATION Vit D one Qd     psyllium (METAMUCIL SMOOTH TEXTURE) 58.6 % powder Take 1 packet by mouth at bedtime.     QUEtiapine (SEROQUEL) 50 MG tablet Take by mouth.     No facility-administered medications prior to visit.     Objective:     BP 130/64 (BP Location: Left Arm, Patient Position: Sitting)    Pulse 74    Temp 98.7 F (37.1 C)    Ht 5\' 9"  (1.753 m)    Wt 189 lb 1.4 oz (85.8 kg)    SpO2 98% Comment: ra   BMI 27.92 kg/m   SpO2: 98 % (ra)  Stoic amb wm nad / awkward gait / min rattling on voluntary cough   Wax both ears   HEENT : pt wearing mask not removed for exam due to covid - 19 concerns.   NECK :  without JVD/Nodes/TM/ nl carotid upstrokes bilaterally   LUNGS: no acc muscle use,  Min barrel  contour chest wall with bilateral  slightly decreased bs s audible wheeze and  without cough on insp or exp maneuvers and min  Hyperresonant  to  percussion bilaterally     CV:  RRR  no s3 or murmur or increase in P2, and no edema   ABD:  soft and nontender with pos end  insp Hoover's  in the supine position. No bruits or organomegaly appreciated, bowel sounds nl  MS:   Nl gait/  ext warm without deformities, calf tenderness, cyanosis or clubbing No obvious joint restrictions   SKIN: warm and dry without lesions    NEURO:  alert, approp, nl sensorium with  no motor or cerebellar deficits apparent.     I personally reviewed images and agree with radiology impression as follows:  CXR:   pa and lateral 03/07/20 Wnl           Assessment   Chronic cough Onset winter/spring 2022 in pt with Lewy body dementia s obvious flares with meals  Improvement in symptoms with abx sounds like he had pna at some point but suspect the problem is one of intermittent asp plus poor mucociliary function due to CB/ smoking so rec   omnicef x 10 days No smoking  rx saba prn  Max rx for gerd then consider ST/ent  eval next   DOE (dyspnea on exertion) Onset winter  spring 2022 - 03/28/2021  patient walked at a slow pace x 150 ft one room iar. Moderate pace on x 300 more feet and mild sob near end with awkward gait and sats never lower than 96%  Most likely due to dementia/ deconditioning with ? Element of copd  rec Ok to try albuterol 15 min before an activity (on alternating days)  that you know would usually make you short of breath and see if it makes any difference and if makes none then don't take albuterol after activity unless you can't catch your breath as this means it's the resting that helps, not the albuterol.      No additional w/u for now, regroup in 6 weeks   Each maintenance medication was reviewed in detail including emphasizing most importantly the difference between maintenance and prns and under what circumstances the prns are to be triggered using an action plan format where appropriate.  Total time for H and P, chart review, counseling, reviewing neb device(s) , directly observing portions of ambulatory 02 saturation study/ and generating customized AVS unique to this office visit / same day charting > 45 min             Christinia Gully, MD 03/28/2021

## 2021-03-28 NOTE — Patient Instructions (Addendum)
Omnicef 300 mg twice daily x 10 days   ENT eval in about 2 weeks in [place]  ([place])    Pantoprazole (protonix) 40 mg   Take  30-60 min before first meal of the day and Pepcid (famotidine)  20 mg after supper until return to office - this is the best way to tell whether stomach acid is contributing to your problem.    GERD (REFLUX)  is an extremely common cause of respiratory symptoms just like yours , many times with no obvious heartburn at all.    It can be treated with medication, but also with lifestyle changes including elevation of the head of your bed (ideally with 6 -8inch blocks under the headboard of your bed),  Smoking cessation, avoidance of late meals, excessive alcohol, and avoid fatty foods, chocolate, peppermint, colas, red wine, and acidic juices such as orange juice.  NO MINT OR MENTHOL PRODUCTS SO NO COUGH DROPS  USE SUGARLESS CANDY INSTEAD (Jolley ranchers or Stover's or Life Savers) or even ice chips will also do - the key is to swallow to prevent all throat clearing. NO OIL BASED VITAMINS - use powdered substitutes.  Avoid fish oil when coughing.   For wheeze/ short of breath can use albuterol up to 4 x daily (use up levoalbuterol 1st)   The key is to stop smoking completely before smoking completely stops you!       Please schedule a follow up office visit in 6 weeks, call sooner if needed

## 2021-03-29 ENCOUNTER — Encounter: Payer: Self-pay | Admitting: Internal Medicine

## 2021-03-29 DIAGNOSIS — R0609 Other forms of dyspnea: Secondary | ICD-10-CM | POA: Insufficient documentation

## 2021-03-29 NOTE — Assessment & Plan Note (Addendum)
Onset winter/spring 2022 in pt with Lewy body dementia s obvious flares with meals  Improvement in symptoms with abx sounds like he had pna at some point but suspect the problem is one of intermittent asp plus poor mucociliary function due to CB/ smoking so rec   omnicef x 10 days No smoking  rx saba prn  Max rx for gerd then consider ST/ent  eval next

## 2021-03-29 NOTE — Assessment & Plan Note (Signed)
Onset winter spring 2022 - 03/28/2021  patient walked at a slow pace x 150 ft one room iar. Moderate pace on x 300 more feet and mild sob near end with awkward gait and sats never lower than 96%  Most likely due to dementia/ deconditioning with ? Element of copd   rec Ok to try albuterol 15 min before an activity (on alternating days)  that you know would usually make you short of breath and see if it makes any difference and if makes none then don't take albuterol after activity unless you can't catch your breath as this means it's the resting that helps, not the albuterol.      No additional w/u for now, regroup in 6 weeks   Each maintenance medication was reviewed in detail including emphasizing most importantly the difference between maintenance and prns and under what circumstances the prns are to be triggered using an action plan format where appropriate.  Total time for H and P, chart review, counseling, reviewing neb device(s) , directly observing portions of ambulatory 02 saturation study/ and generating customized AVS unique to this office visit / same day charting > 45 min

## 2021-04-07 ENCOUNTER — Other Ambulatory Visit: Payer: Self-pay | Admitting: Family Medicine

## 2021-04-08 ENCOUNTER — Ambulatory Visit (INDEPENDENT_AMBULATORY_CARE_PROVIDER_SITE_OTHER): Payer: Medicare HMO | Admitting: Family Medicine

## 2021-04-08 ENCOUNTER — Other Ambulatory Visit: Payer: Self-pay

## 2021-04-08 DIAGNOSIS — H6123 Impacted cerumen, bilateral: Secondary | ICD-10-CM | POA: Insufficient documentation
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
## 2021-04-08 NOTE — Assessment & Plan Note (Signed)
Successful irrigation/lavage today. Advised use of over-the-counter Debrox to help prevent recurrence.

## 2021-05-13 ENCOUNTER — Ambulatory Visit: Payer: Medicare HMO | Admitting: Internal Medicine

## 2021-06-25 ENCOUNTER — Ambulatory Visit (INDEPENDENT_AMBULATORY_CARE_PROVIDER_SITE_OTHER): Payer: Medicare HMO | Admitting: Family Medicine

## 2021-06-25 VITALS — BP 120/80 | HR 73 | Temp 97.5°F | Ht 69.0 in | Wt 181.0 lb

## 2021-06-25 DIAGNOSIS — R350 Frequency of micturition: Secondary | ICD-10-CM

## 2021-06-25 DIAGNOSIS — R053 Chronic cough: Secondary | ICD-10-CM | POA: Diagnosis not present

## 2021-06-25 LAB — POCT URINALYSIS DIP (MANUAL ENTRY)
Bilirubin, UA: NEGATIVE
Blood, UA: NEGATIVE
Glucose, UA: NEGATIVE mg/dL
Ketones, POC UA: NEGATIVE mg/dL
Leukocytes, UA: NEGATIVE
Nitrite, UA: NEGATIVE
Spec Grav, UA: >=1.03 — AB (ref 1.010–1.025)
Urobilinogen, UA: 0.2 E.U./dL
pH, UA: 5 (ref 5.0–8.0)

## 2021-06-25 MED ORDER — BUDESONIDE-FORMOTEROL FUMARATE 160-4.5 MCG/ACT IN AERO: 2.0000 | INHALATION_SPRAY | Freq: Two times a day (BID) | RESPIRATORY_TRACT | 3 refills | Status: DC

## 2021-06-25 MED ORDER — SPACER/AERO-HOLDING CHAMBERS DEVI: 0 refills | Status: DC

## 2021-06-25 NOTE — Patient Instructions (Signed)
Medication as directed. ? ?Call with concerns. ? ?Take care ? ?Dr. Lacinda Axon ?

## 2021-06-26 DIAGNOSIS — R350 Frequency of micturition: Secondary | ICD-10-CM | POA: Insufficient documentation

## 2021-06-26 NOTE — Assessment & Plan Note (Signed)
UA with no evidence of infection.  Advised to increase hydration. ?

## 2021-06-26 NOTE — Progress Notes (Signed)
? ?Subjective:  ?Patient ID: Luis Warner, male    DOB: 08-20-1950  Age: 71 y.o. MRN: [id number] ? ?CC: ?Chief Complaint  ?Patient presents with  ? Urinary Frequency  ?  Dementia w/Increased confusion, unsteady gait, urine freq. , incontinence x 3 days  ? ? ?HPI: ? ?71 year old male with underlying dementia presents for evaluation of the above. ? ?Wife is primary historian.  Wife states that he has had some nocturia over the past 3 days.  He has had incontinence as well.  He seems to be a little more unsteady on his feet and has also been more agitated and anxious.  She is concerned that he may have a UTI.  Has had ongoing issues with chronic cough.  Has not responded to treatment.  No reports of fever.  No reports of abdominal pain.  No other complaints or concerns at this time. ? ?Patient Active Problem List  ? Diagnosis Date Noted  ? Frequency of urination 06/26/2021  ? DOE (dyspnea on exertion) 03/29/2021  ? Chronic cough 03/28/2021  ? Chronic diarrhea 03/08/2020  ? Weight loss 03/08/2020  ? Dementia with behavioral disturbance ([place]) 01/17/2020  ? MDD (major depressive disorder), severe ([place]) 01/17/2020  ? Traumatic brain injury ([place]) 01/15/2020  ?  Class: History of  ? Lewy body dementia with behavioral disturbance ([place]) 01/21/2019  ? Dementia, multiinfarct, with behavioral disturbance ([place]) 07/21/2017  ? Anxiety disorder due to brain injury 07/21/2017  ? Sleep behavior disorder, REM 06/29/2017  ? ? ?Social Hx   ?Social History  ? ?Socioeconomic History  ? Marital status: Married  ?  Spouse name: Not on file  ? Number of children: Not on file  ? Years of education: Not on file  ? Highest education level: Not on file  ?Occupational History  ? Not on file  ?Tobacco Use  ? Smoking status: Every Day  ?  Packs/day: 0.25  ?  Types: Cigarettes  ? Smokeless tobacco: Never  ?Vaping Use  ? Vaping Use: Never used  ?Substance and Sexual Activity  ? Alcohol use: Not Currently  ?  Alcohol/week: 0.0 standard drinks  ? Drug  use: Never  ? Sexual activity: Not on file  ?Other Topics Concern  ? Not on file  ?Social History Narrative  ? Not on file  ? ?Social Determinants of Health  ? ?Financial Resource Strain: Not on file  ?Food Insecurity: Not on file  ?Transportation Needs: Not on file  ?Physical Activity: Not on file  ?Stress: Not on file  ?Social Connections: Not on file  ? ? ?Review of Systems ?Per HPI ? ?Objective:  ?BP 120/80   Pulse 73   Temp (!) 97.5 ?F (36.4 ?C)   Ht '5\' 9"'$  (1.753 m)   Wt 181 lb (82.1 kg)   SpO2 98%   BMI 26.73 kg/m?  ? ? ?  06/25/2021  ?  3:27 PM 04/08/2021  ?  9:26 AM 03/28/2021  ?  9:55 AM  ?BP/Weight  ?Systolic BP 938 101 751  ?Diastolic BP 80 77 64  ?Wt. (Lbs) 181 184.2 189.09  ?BMI 26.73 kg/m2 27.2 kg/m2 27.92 kg/m2  ? ? ?Physical Exam ?Vitals and nursing note reviewed.  ?Constitutional:   ?   General: He is not in acute distress. ?   Appearance: Normal appearance.  ?HENT:  ?   Head: Normocephalic and atraumatic.  ?Eyes:  ?   General:     ?   Right eye: No discharge.     ?  Left eye: No discharge.  ?   Conjunctiva/sclera: Conjunctivae normal.  ?Cardiovascular:  ?   Rate and Rhythm: Normal rate and regular rhythm.  ?Pulmonary:  ?   Effort: Pulmonary effort is normal.  ?   Breath sounds: Wheezing present.  ?Abdominal:  ?   General: There is no distension.  ?   Palpations: Abdomen is soft.  ?   Tenderness: There is no abdominal tenderness.  ?Neurological:  ?   Mental Status: He is alert.  ? ? ?Lab Results  ?Component Value Date  ? WBC 10.0 01/08/2021  ? HGB 13.9 01/08/2021  ? HCT 42.6 01/08/2021  ? PLT 212 01/08/2021  ? GLUCOSE 95 01/08/2021  ? CHOL 151 01/09/2017  ? TRIG 50 01/09/2017  ? HDL 46 01/09/2017  ? [place] 95 01/09/2017  ? ALT 13 01/08/2021  ? AST 16 01/08/2021  ? NA 137 01/08/2021  ? K 4.2 01/08/2021  ? CL 104 01/08/2021  ? CREATININE 1.14 01/08/2021  ? BUN 17 01/08/2021  ? CO2 26 01/08/2021  ? TSH 2.83 03/12/2020  ? ? ? ?Assessment & Plan:  ? ?Problem List Items Addressed This Visit   ? ?  ?  Other  ? Chronic cough  ?  Ongoing chronic cough with associated wheezing.  Has seen pulmonology.  Trial of Symbicort. ? ?  ?  ? Frequency of urination - Primary  ?  UA with no evidence of infection.  Advised to increase hydration. ? ?  ?  ? Relevant Orders  ? POCT urinalysis dipstick (Completed)  ? ? ?Meds ordered this encounter  ?Medications  ? budesonide-formoterol (SYMBICORT) 160-4.5 MCG/ACT inhaler  ?  Sig: Inhale 2 puffs into the lungs 2 (two) times daily.  ?  Dispense:  1 each  ?  Refill:  3  ? Spacer/Aero-Holding Josiah Lobo DEVI  ?  Sig: Use as directed with inhaler.  ?  Dispense:  1 each  ?  Refill:  0  ? ?Thersa Salt DO ?[place] ? ?

## 2021-06-26 NOTE — Assessment & Plan Note (Signed)
Ongoing chronic cough with associated wheezing.  Has seen pulmonology.  Trial of Symbicort. ?

## 2021-06-27 ENCOUNTER — Telehealth: Payer: Self-pay | Admitting: *Deleted

## 2021-06-27 ENCOUNTER — Other Ambulatory Visit: Payer: Self-pay | Admitting: Family Medicine

## 2021-06-27 NOTE — Telephone Encounter (Signed)
Fax received for [facility] on [address]: Budesonide/Form 160/4.30mg not covered by patient's plan- preferred alternatives include: Symbicort Aer, Advair Disk Aer, Breo ellip inhaler    Please advise

## 2021-06-27 NOTE — Telephone Encounter (Signed)
Pharmacist stated they were able to get it to go thru and patient has picked up the script and disregard previous message.

## 2021-06-27 NOTE — Telephone Encounter (Signed)
Thersa Salt G, DO    Symbicort Aer is good with me.

## 2021-11-30 ENCOUNTER — Other Ambulatory Visit: Payer: Self-pay | Admitting: Family Medicine

## 2022-01-08 ENCOUNTER — Other Ambulatory Visit: Payer: Self-pay | Admitting: Family Medicine

## 2022-02-01 ENCOUNTER — Emergency Department (HOSPITAL_COMMUNITY)
Admission: EM | Admit: 2022-02-01 | Discharge: 2022-02-01 | Disposition: A | Discharge status: Home / Self Care | Payer: Medicare HMO | Attending: Emergency Medicine | Admitting: Emergency Medicine

## 2022-02-01 ENCOUNTER — Encounter (HOSPITAL_COMMUNITY): Payer: Self-pay | Admitting: Emergency Medicine

## 2022-02-01 ENCOUNTER — Emergency Department (HOSPITAL_COMMUNITY): Payer: Medicare HMO

## 2022-02-01 DIAGNOSIS — R531 Weakness: Secondary | ICD-10-CM | POA: Insufficient documentation

## 2022-02-01 DIAGNOSIS — W19XXXA Unspecified fall, initial encounter: Secondary | ICD-10-CM | POA: Diagnosis not present

## 2022-02-01 DIAGNOSIS — R4182 Altered mental status, unspecified: Secondary | ICD-10-CM | POA: Diagnosis not present

## 2022-02-01 DIAGNOSIS — N179 Acute kidney failure, unspecified: Secondary | ICD-10-CM | POA: Diagnosis not present

## 2022-02-01 DIAGNOSIS — G3183 Dementia with Lewy bodies: Secondary | ICD-10-CM | POA: Insufficient documentation

## 2022-02-01 DIAGNOSIS — F0280 Dementia in other diseases classified elsewhere without behavioral disturbance: Secondary | ICD-10-CM | POA: Insufficient documentation

## 2022-02-01 DIAGNOSIS — Z20822 Contact with and (suspected) exposure to covid-19: Secondary | ICD-10-CM | POA: Diagnosis not present

## 2022-02-01 DIAGNOSIS — N3000 Acute cystitis without hematuria: Secondary | ICD-10-CM | POA: Diagnosis not present

## 2022-02-01 DIAGNOSIS — R059 Cough, unspecified: Secondary | ICD-10-CM | POA: Insufficient documentation

## 2022-02-01 LAB — CBC
HCT: 43.5 % (ref 39.0–52.0)
Hemoglobin: 14.1 g/dL (ref 13.0–17.0)
MCH: 28.9 pg (ref 26.0–34.0)
MCHC: 32.4 g/dL (ref 30.0–36.0)
MCV: 89.1 fL (ref 80.0–100.0)
Platelets: 170 10*3/uL (ref 150–400)
RBC: 4.88 MIL/uL (ref 4.22–5.81)
RDW: 14 % (ref 11.5–15.5)
WBC: 7.2 10*3/uL (ref 4.0–10.5)
nRBC: 0 % (ref 0.0–0.2)

## 2022-02-01 LAB — URINALYSIS, ROUTINE W REFLEX MICROSCOPIC
Bacteria, UA: NONE SEEN
Bilirubin Urine: NEGATIVE
Glucose, UA: NEGATIVE mg/dL
Ketones, ur: NEGATIVE mg/dL
Leukocytes,Ua: NEGATIVE
Nitrite: POSITIVE — AB
Protein, ur: 30 mg/dL — AB
Specific Gravity, Urine: 1.018 (ref 1.005–1.030)
pH: 5 (ref 5.0–8.0)

## 2022-02-01 LAB — RESP PANEL BY RT-PCR (RSV, FLU A&B, COVID)  RVPGX2
Influenza A by PCR: NEGATIVE
Influenza B by PCR: NEGATIVE
Resp Syncytial Virus by PCR: NEGATIVE
SARS Coronavirus 2 by RT PCR: NEGATIVE

## 2022-02-01 LAB — BASIC METABOLIC PANEL
Anion gap: 10 (ref 5–15)
BUN: 17 mg/dL (ref 8–23)
CO2: 26 mmol/L (ref 22–32)
Calcium: 9.1 mg/dL (ref 8.9–10.3)
Chloride: 103 mmol/L (ref 98–111)
Creatinine, Ser: 1.35 mg/dL — ABNORMAL HIGH (ref 0.61–1.24)
GFR, Estimated: 55 mL/min — ABNORMAL LOW (ref >60)
Glucose, Bld: 93 mg/dL (ref 70–99)
Potassium: 4.1 mmol/L (ref 3.5–5.1)
Sodium: 139 mmol/L (ref 135–145)

## 2022-02-01 LAB — TROPONIN I (HIGH SENSITIVITY)
Troponin I (High Sensitivity): 8 ng/L (ref <18)
Troponin I (High Sensitivity): 8 ng/L (ref <18)

## 2022-02-01 LAB — CBG MONITORING, ED: Glucose-Capillary: 90 mg/dL (ref 70–99)

## 2022-02-01 MED ORDER — SODIUM CHLORIDE 0.9 % IV BOLUS
500.0000 mL | Freq: Once | INTRAVENOUS | Status: AC
  Administered 2022-02-01: 500 mL via INTRAVENOUS

## 2022-02-01 MED ORDER — SULFAMETHOXAZOLE-TRIMETHOPRIM 800-160 MG PO TABS: 1.0000 | ORAL_TABLET | Freq: Two times a day (BID) | ORAL | 0 refills | Status: AC

## 2022-02-01 MED ORDER — SODIUM CHLORIDE 0.9 % IV BOLUS
1000.0000 mL | Freq: Once | INTRAVENOUS | Status: AC
  Administered 2022-02-01: 1000 mL via INTRAVENOUS

## 2022-02-01 MED ORDER — SULFAMETHOXAZOLE-TRIMETHOPRIM 800-160 MG PO TABS
1.0000 | ORAL_TABLET | Freq: Once | ORAL | Status: AC
  Administered 2022-02-01: 1 via ORAL
  Filled 2022-02-01: qty 1

## 2022-02-01 NOTE — ED Provider Notes (Signed)
[facility] EMERGENCY DEPARTMENT Provider Note   CSN: [id number] Arrival date & time: 02/01/22  1515     History {Add pertinent medical, surgical, social history, OB history to HPI:1} Chief Complaint  Patient presents with   Weakness   Cough    Luis Warner is a 71 y.o. male.  Patient is a 71 year old male with past medical history of Lewy body dementia presenting for worsening of baseline mental status over the last 24 hours after a possible unwitnessed fall.  The family states that yesterday they found him the floor with urinary and bowel incontinence.  States he normally has urinary incontinence at baseline.  They also state that he is normally confused but has had significant worsening of baseline confusion.  They state the patient is primarily stationary and switches from the bed to a recliner chair.  Denies any fevers, chills, coughing, nausea, or diarrhea.  They deny sick contacts.  She does not on blood thinners.  They deny any new rashes but admits to a sacral decubitus ulcer.Admits to cough that they state patient has had for the past year and has had multiple negative CXR's.   The history is provided by the patient. No language interpreter was used.  Weakness Associated symptoms: cough   Associated symptoms: no abdominal pain, no arthralgias, no chest pain, no dysuria, no fever, no seizures, no shortness of breath and no vomiting   Cough Associated symptoms: no chest pain, no chills, no ear pain, no fever, no rash, no shortness of breath and no sore throat        Home Medications Prior to Admission medications   Medication Sig Start Date End Date Taking? Authorizing Provider  Cholecalciferol 25 MCG (1000 UT) tablet Take by mouth. 01/24/20   [provider]  clonazePAM (KLONOPIN) 0.5 MG tablet Take 0.5 mg by mouth 2 (two) times daily as needed. 02/20/21   [provider]  donepezil (ARICEPT) 10 MG tablet Take 10 mg by mouth daily. 12/13/19   [provider]  famotidine (PEPCID) 20 MG tablet One after supper 03/28/21   Tanda Rockers, MD  levalbuterol Penne Lash) 0.63 MG/3ML nebulizer solution Take 3 mLs (0.63 mg total) by nebulization every 4 (four) hours as needed for wheezing or shortness of breath. 03/07/21   Ameduite, Trenton Gammon, FNP  memantine (NAMENDA) 10 MG tablet 10 mg 2 (two) times daily.  05/12/19   [provider]  modafinil (PROVIGIL) 100 MG tablet Take 100 mg by mouth daily.    [provider]  pantoprazole (PROTONIX) 40 MG tablet Take 1 tablet (40 mg total) by mouth daily. Take 30-60 min before first meal of the day 03/28/21   Tanda Rockers, MD  sertraline (ZOLOFT) 100 MG tablet Take 100 mg by mouth in the morning and at bedtime. 02/27/14   [provider]  sertraline (ZOLOFT) 25 MG tablet Take 25 mg by mouth 2 (two) times daily. 02/11/21   [provider]  Spacer/Aero-Holding Dorise Bullion Use as directed with inhaler. 06/25/21   Coral Spikes, DO  SYMBICORT 160-4.5 MCG/ACT inhaler INHALE 2 PUFFS INTO THE LUNGS TWICE DAILY 01/09/22   Kathyrn Drown, MD  traZODone (DESYREL) 50 MG tablet Take 50 mg by mouth at bedtime. 06/24/21   [provider]      Allergies    Penicillins    Review of Systems   Review of Systems  Constitutional:  Negative for chills and fever.  HENT:  Negative for ear  pain and sore throat.   Eyes:  Negative for pain and visual disturbance.  Respiratory:  Positive for cough. Negative for shortness of breath.   Cardiovascular:  Negative for chest pain and palpitations.  Gastrointestinal:  Negative for abdominal pain and vomiting.  Genitourinary:  Negative for dysuria and hematuria.  Musculoskeletal:  Negative for arthralgias and back pain.  Skin:  Negative for color change and rash.  Neurological:  Positive for weakness. Negative for seizures and syncope.  All other systems reviewed and are negative.   Physical Exam Updated Vital Signs BP (!) 159/83 (BP  Location: Left Arm)   Pulse 65   Temp 99.6 F (37.6 C) (Oral)   Resp 14   Ht '5\' 9"'$  (1.753 m)   Wt 81.6 kg   SpO2 99%   BMI 26.58 kg/m  Physical Exam Vitals and nursing note reviewed.  Constitutional:      General: He is not in acute distress.    Appearance: He is well-developed.  HENT:     Head: Normocephalic and atraumatic.  Eyes:     Conjunctiva/sclera: Conjunctivae normal.  Cardiovascular:     Rate and Rhythm: Normal rate and regular rhythm.     Heart sounds: No murmur heard. Pulmonary:     Effort: Pulmonary effort is normal. No respiratory distress.     Breath sounds: Normal breath sounds.  Abdominal:     Palpations: Abdomen is soft.     Tenderness: There is no abdominal tenderness.  Musculoskeletal:        General: No swelling.     Cervical back: Neck supple.  Skin:    General: Skin is warm and dry.     Capillary Refill: Capillary refill takes less than 2 seconds.  Neurological:     Mental Status: He is alert. He is disoriented and confused.     GCS: GCS eye subscore is 4. GCS verbal subscore is 4. GCS motor subscore is 6.     Sensory: Sensation is intact.     Motor: Motor function is intact.  Psychiatric:        Mood and Affect: Mood normal.     ED Results / Procedures / Treatments   Labs (all labs ordered are listed, but only abnormal results are displayed) Labs Reviewed  RESP PANEL BY RT-PCR (RSV, FLU A&B, COVID)  [id number]  BASIC METABOLIC PANEL  CBC  URINALYSIS, ROUTINE W REFLEX MICROSCOPIC  CBG MONITORING, ED  TROPONIN I (HIGH SENSITIVITY)    EKG None  Radiology CT Head Wo Contrast  Result Date: 02/01/2022 CLINICAL DATA:  Fall yesterday.  Patient found on the ground. EXAM: CT HEAD WITHOUT CONTRAST CT CERVICAL SPINE WITHOUT CONTRAST TECHNIQUE: Multidetector CT imaging of the head and cervical spine was performed following the standard protocol without intravenous contrast. Multiplanar CT image reconstructions of the cervical spine were also  generated. RADIATION DOSE REDUCTION: This exam was performed according to the departmental dose-optimization program which includes automated exposure control, adjustment of the mA and/or kV according to patient size and/or use of iterative reconstruction technique. COMPARISON:  11/06/2009. FINDINGS: CT HEAD FINDINGS Brain: No evidence of acute infarction, hemorrhage, hydrocephalus, extra-axial collection or mass lesion/mass effect. Patchy white matter hypoattenuation is noted consistent with mild chronic microvascular ischemic change. Vascular: No hyperdense vessel or unexpected calcification. Skull: Normal. Negative for fracture or focal lesion. Sinuses/Orbits: Globes and orbits are unremarkable. Mild left frontal and left ethmoid air cell mucosal thickening. Left sphenoid sinus is opacified. Other: None. CT CERVICAL SPINE FINDINGS  Alignment: Normal. Skull base and vertebrae: No acute fracture. No primary bone lesion or focal pathologic process. Soft tissues and spinal canal: No prevertebral fluid or swelling. No visible canal hematoma. Disc levels: Moderate loss of disc height at C3-C4 and C5-C6 with endplate spurring and mild disc bulging. Remaining discs spaces are well preserved. Facet degenerative changes noted on the left most prominently at C2-C3 and C3-C4. No convincing disc herniation. Upper chest: No acute findings. Other: None. IMPRESSION: HEAD CT 1. No acute intracranial abnormalities. CERVICAL CT 1. No fracture or acute finding. Electronically Signed   By: Lajean Manes M.D.   On: 02/01/2022 16:08   CT Cervical Spine Wo Contrast  Result Date: 02/01/2022 CLINICAL DATA:  Fall yesterday.  Patient found on the ground. EXAM: CT HEAD WITHOUT CONTRAST CT CERVICAL SPINE WITHOUT CONTRAST TECHNIQUE: Multidetector CT imaging of the head and cervical spine was performed following the standard protocol without intravenous contrast. Multiplanar CT image reconstructions of the cervical spine were also  generated. RADIATION DOSE REDUCTION: This exam was performed according to the departmental dose-optimization program which includes automated exposure control, adjustment of the mA and/or kV according to patient size and/or use of iterative reconstruction technique. COMPARISON:  11/06/2009. FINDINGS: CT HEAD FINDINGS Brain: No evidence of acute infarction, hemorrhage, hydrocephalus, extra-axial collection or mass lesion/mass effect. Patchy white matter hypoattenuation is noted consistent with mild chronic microvascular ischemic change. Vascular: No hyperdense vessel or unexpected calcification. Skull: Normal. Negative for fracture or focal lesion. Sinuses/Orbits: Globes and orbits are unremarkable. Mild left frontal and left ethmoid air cell mucosal thickening. Left sphenoid sinus is opacified. Other: None. CT CERVICAL SPINE FINDINGS Alignment: Normal. Skull base and vertebrae: No acute fracture. No primary bone lesion or focal pathologic process. Soft tissues and spinal canal: No prevertebral fluid or swelling. No visible canal hematoma. Disc levels: Moderate loss of disc height at C3-C4 and C5-C6 with endplate spurring and mild disc bulging. Remaining discs spaces are well preserved. Facet degenerative changes noted on the left most prominently at C2-C3 and C3-C4. No convincing disc herniation. Upper chest: No acute findings. Other: None. IMPRESSION: HEAD CT 1. No acute intracranial abnormalities. CERVICAL CT 1. No fracture or acute finding. Electronically Signed   By: Lajean Manes M.D.   On: 02/01/2022 16:08    Procedures Procedures  {Document cardiac monitor, telemetry assessment procedure when appropriate:1}  Medications Ordered in ED Medications  sodium chloride 0.9 % bolus 1,000 mL (has no administration in time range)    ED Course/ Medical Decision Making/ A&P                           Medical Decision Making Amount and/or Complexity of Data Reviewed Labs: ordered. Radiology:  ordered.   16:9 PM  71 year old male with past medical history of Lewy body dementia presenting for worsening of baseline mental status over the last 24 hours after a possible unwitnessed fall.  Patient is alert but disoriented and confused on exam.  Able to follow commands.  No sensation or motor dysfunction.  No gross signs of trauma on physical exam.  CT head and neck demonstrates no acute process.  {Document critical care time when appropriate:1} {Document review of labs and clinical decision tools ie heart score, Chads2Vasc2 etc:1}  {Document your independent review of radiology images, and any outside records:1} {Document your discussion with family members, caretakers, and with consultants:1} {Document social determinants of health affecting pt's care:1} {Document your decision making why  or why not admission, treatments were needed:1} Final Clinical Impression(s) / ED Diagnoses Final diagnoses:  None    Rx / DC Orders ED Discharge Orders     None

## 2022-02-01 NOTE — ED Notes (Signed)
Family reports altered mental status, abnormal behavior, and incontinence for the past 24 hours. A transient non - productive cough for the past 2-3 weeks. Failure to thrive and decreased physical activity for the past year. Patient had an unwitnessed fall yesterday.

## 2022-02-01 NOTE — Discharge Instructions (Signed)
CT scanning of the head and the neck demonstrate no acute intracranial bleeds or fractures from the fall. Laboratory studies demonstrate no leukocytosis or signs of or symptoms of sepsis.  Patient does have a mild acute kidney injury likely secondary to dehydration.  Please continue to help patient hydrate by drinking water at home.  His urine was very weakly positive for urinary tract infection and was nitrite positive.  We will treat with TMP sulfa.  Antibiotic sent to pharmacy.  COVID, influenza, and RSV testing was negative.  Please return for any worsening concerning signs or symptoms of confusion.

## 2022-02-01 NOTE — ED Notes (Signed)
ED Provider at bedside. 

## 2022-02-01 NOTE — ED Triage Notes (Signed)
Pt arrives today after having a fall yesterday. Wife had gone out for about 45 minutes and he fell within that time, wife found pt on the ground. Pt had drool on himself, as well incontinent of urine and stool . Pt is weaker than normal. Pt has had cough as well.

## 2022-02-04 ENCOUNTER — Telehealth: Payer: Self-pay | Admitting: *Deleted

## 2022-02-04 NOTE — Telephone Encounter (Signed)
Transition Care Management Unsuccessful Follow-up Telephone Call  Date of discharge and from where:  02/01/22 [facility] ER  Attempts:  1st Attempt  Reason for unsuccessful TCM follow-up call:  Left voice message

## 2022-02-06 ENCOUNTER — Telehealth: Payer: Self-pay | Admitting: *Deleted

## 2022-02-06 NOTE — Telephone Encounter (Signed)
Transition Care Management Follow-up Telephone Call Date of discharge and from where: 02/01/22 [facility] How have you been since you were released from the hospital? Doing better is walking better has pallative care nurse coming in next week Any questions or concerns? No  Items Reviewed: Did the pt receive and understand the discharge instructions provided? Yes  Medications obtained and verified? Yes  Other? No  Any new allergies since your discharge? No  Dietary orders reviewed? Yes Do you have support at home? Yes    Functional Questionnaire: (I = Independent and D = Dependent) ADLs: d  Bathing/Dressing- d  Meal Prep- d  Eating- i  Maintaining continence- i  Transferring/Ambulation- d  Managing Meds- d  Follow up appointments reviewed:  PCP Hospital f/u appt confirmed?  Is seeing pallative next week will follow  up with PCP if needed   Are transportation arrangements needed? No  If their condition worsens, is the pt aware to call PCP or go to the Emergency Dept.? Yes Was the patient provided with contact information for the PCP's office or ED? Yes Was to pt encouraged to call back with questions or concerns? Yes

## 2022-03-11 ENCOUNTER — Telehealth: Payer: Self-pay | Admitting: Family Medicine

## 2022-03-11 NOTE — Telephone Encounter (Signed)
May go ahead with Macrobid, 1 taken twice daily for the next 7 days  Also please find out from his hospice service is patient likely homebound currently/permanently?  Essentially trying to get a update regarding his situation thank you

## 2022-03-11 NOTE — Telephone Encounter (Signed)
Supportive care from [facility] calling to see if PCP would send in something for possible UTI. Pt is having increased frequent urination. Pt wife states he is unable to come in to the office. Please advise. Thank you

## 2022-03-12 MED ORDER — NITROFURANTOIN MONOHYD MACRO 100 MG PO CAPS: ORAL_CAPSULE | ORAL | 0 refills | Status: DC

## 2022-03-12 NOTE — Addendum Note (Signed)
Addended by: Vicente Males on: 03/12/2022 08:56 AM   Modules accepted: Orders

## 2022-03-12 NOTE — Telephone Encounter (Signed)
Antibiotic sent to [facility]. Left message for Jocelyn Lamer (pt wife) to return call

## 2022-03-12 NOTE — Telephone Encounter (Signed)
Wife (DPR) notified- wife stated they are using [facility] thru [facility]- it was set up by neurologist and he is basically homebound because it it is really hard for her to get him in the car and get him anywhere- the son helps if it is absolutely necessary he be seen somewhere such as dr but that is very limited at this point due to his worsening condition.

## 2022-03-31 ENCOUNTER — Ambulatory Visit: Payer: Medicare HMO | Admitting: Urology

## 2022-06-11 DEATH — deceased
